# Patient Record
Sex: Female | Born: 1990 | Hispanic: Yes | Marital: Single | Smoking: Never smoker
Health system: Southern US, Community
[De-identification: ages and names within clinical notes are randomized; demographics above are authoritative.]

## PROBLEM LIST (undated history)

## (undated) DIAGNOSIS — I639 Cerebral infarction, unspecified: Secondary | ICD-10-CM

## (undated) DIAGNOSIS — D649 Anemia, unspecified: Secondary | ICD-10-CM

## (undated) HISTORY — DX: Cerebral infarction, unspecified: I63.9

## (undated) HISTORY — DX: Anemia, unspecified: D64.9

---

## 2020-03-05 ENCOUNTER — Inpatient Hospital Stay (HOSPITAL_COMMUNITY)
Admission: AD | Admit: 2020-03-05 | Payer: Medicaid Other | Source: Home / Self Care | Admitting: Obstetrics and Gynecology

## 2020-03-30 ENCOUNTER — Other Ambulatory Visit: Payer: Self-pay | Admitting: Obstetrics and Gynecology

## 2020-03-30 DIAGNOSIS — Z364 Encounter for antenatal screening for fetal growth retardation: Secondary | ICD-10-CM

## 2020-03-30 DIAGNOSIS — Z363 Encounter for antenatal screening for malformations: Secondary | ICD-10-CM

## 2020-04-10 ENCOUNTER — Encounter: Payer: Self-pay | Admitting: *Deleted

## 2020-04-14 ENCOUNTER — Encounter: Payer: Self-pay | Admitting: *Deleted

## 2020-04-14 ENCOUNTER — Ambulatory Visit: Payer: Medicaid Other | Attending: Obstetrics and Gynecology

## 2020-04-14 ENCOUNTER — Ambulatory Visit: Payer: Medicaid Other | Admitting: *Deleted

## 2020-04-14 ENCOUNTER — Other Ambulatory Visit: Payer: Self-pay | Admitting: Obstetrics and Gynecology

## 2020-04-14 ENCOUNTER — Other Ambulatory Visit: Payer: Self-pay

## 2020-04-14 VITALS — BP 123/56 | HR 102

## 2020-04-14 DIAGNOSIS — Z364 Encounter for antenatal screening for fetal growth retardation: Secondary | ICD-10-CM

## 2020-04-14 DIAGNOSIS — Z363 Encounter for antenatal screening for malformations: Secondary | ICD-10-CM | POA: Diagnosis not present

## 2020-04-14 DIAGNOSIS — O34219 Maternal care for unspecified type scar from previous cesarean delivery: Secondary | ICD-10-CM | POA: Diagnosis present

## 2020-05-19 ENCOUNTER — Encounter: Payer: Self-pay | Admitting: *Deleted

## 2020-05-25 ENCOUNTER — Other Ambulatory Visit (INDEPENDENT_AMBULATORY_CARE_PROVIDER_SITE_OTHER): Payer: Medicaid Other | Admitting: Obstetrics and Gynecology

## 2020-05-25 NOTE — Progress Notes (Signed)
preop orders placed

## 2020-07-07 ENCOUNTER — Inpatient Hospital Stay (HOSPITAL_COMMUNITY): Payer: Medicaid Other | Admitting: Anesthesiology

## 2020-07-07 ENCOUNTER — Other Ambulatory Visit: Payer: Self-pay

## 2020-07-07 ENCOUNTER — Encounter (HOSPITAL_COMMUNITY): Payer: Self-pay | Admitting: Family Medicine

## 2020-07-07 ENCOUNTER — Inpatient Hospital Stay (HOSPITAL_COMMUNITY)
Admission: AD | Admit: 2020-07-07 | Discharge: 2020-07-10 | DRG: 787 | Disposition: A | Discharge status: Home / Self Care | Payer: Medicaid Other | Attending: Obstetrics and Gynecology | Admitting: Obstetrics and Gynecology

## 2020-07-07 ENCOUNTER — Encounter (HOSPITAL_COMMUNITY)
Admission: AD | Disposition: A | Discharge status: Home / Self Care | Payer: Self-pay | Source: Home / Self Care | Attending: Obstetrics and Gynecology

## 2020-07-07 DIAGNOSIS — O99019 Anemia complicating pregnancy, unspecified trimester: Secondary | ICD-10-CM | POA: Diagnosis present

## 2020-07-07 DIAGNOSIS — Z98891 History of uterine scar from previous surgery: Secondary | ICD-10-CM

## 2020-07-07 DIAGNOSIS — O9081 Anemia of the puerperium: Secondary | ICD-10-CM | POA: Diagnosis not present

## 2020-07-07 DIAGNOSIS — O34211 Maternal care for low transverse scar from previous cesarean delivery: Secondary | ICD-10-CM | POA: Diagnosis present

## 2020-07-07 DIAGNOSIS — O42913 Preterm premature rupture of membranes, unspecified as to length of time between rupture and onset of labor, third trimester: Principal | ICD-10-CM | POA: Diagnosis present

## 2020-07-07 DIAGNOSIS — Z975 Presence of (intrauterine) contraceptive device: Secondary | ICD-10-CM

## 2020-07-07 DIAGNOSIS — D62 Acute posthemorrhagic anemia: Secondary | ICD-10-CM | POA: Diagnosis not present

## 2020-07-07 DIAGNOSIS — Z3A36 36 weeks gestation of pregnancy: Secondary | ICD-10-CM | POA: Diagnosis not present

## 2020-07-07 DIAGNOSIS — Z8673 Personal history of transient ischemic attack (TIA), and cerebral infarction without residual deficits: Secondary | ICD-10-CM | POA: Diagnosis not present

## 2020-07-07 DIAGNOSIS — O42113 Preterm premature rupture of membranes, onset of labor more than 24 hours following rupture, third trimester: Secondary | ICD-10-CM

## 2020-07-07 DIAGNOSIS — Z30430 Encounter for insertion of intrauterine contraceptive device: Secondary | ICD-10-CM | POA: Diagnosis not present

## 2020-07-07 DIAGNOSIS — Z20822 Contact with and (suspected) exposure to covid-19: Secondary | ICD-10-CM | POA: Diagnosis present

## 2020-07-07 LAB — RESP PANEL BY RT-PCR (FLU A&B, COVID) ARPGX2
Influenza A by PCR: NEGATIVE
Influenza B by PCR: NEGATIVE
SARS Coronavirus 2 by RT PCR: NEGATIVE

## 2020-07-07 LAB — CBC
HCT: 32.5 % — ABNORMAL LOW (ref 36.0–46.0)
Hemoglobin: 10.5 g/dL — ABNORMAL LOW (ref 12.0–15.0)
MCH: 27.5 pg (ref 26.0–34.0)
MCHC: 32.3 g/dL (ref 30.0–36.0)
MCV: 85.1 fL (ref 80.0–100.0)
Platelets: 248 10*3/uL (ref 150–400)
RBC: 3.82 MIL/uL — ABNORMAL LOW (ref 3.87–5.11)
RDW: 14.1 % (ref 11.5–15.5)
WBC: 8.4 10*3/uL (ref 4.0–10.5)
nRBC: 0 % (ref 0.0–0.2)

## 2020-07-07 LAB — POCT FERN TEST: POCT Fern Test: NEGATIVE

## 2020-07-07 LAB — TYPE AND SCREEN
ABO/RH(D): A POS
Antibody Screen: NEGATIVE

## 2020-07-07 LAB — AMNISURE RUPTURE OF MEMBRANE (ROM) NOT AT ARMC: Amnisure ROM: POSITIVE

## 2020-07-07 SURGERY — Surgical Case
Anesthesia: Spinal

## 2020-07-07 MED ORDER — ONDANSETRON HCL 4 MG/2ML IJ SOLN: 4.0000 mg | Freq: Once | INTRAMUSCULAR | Status: DC | PRN

## 2020-07-07 MED ORDER — FAMOTIDINE IN NACL 20-0.9 MG/50ML-% IV SOLN
20.0000 mg | Freq: Once | INTRAVENOUS | Status: AC
  Administered 2020-07-07: 20 mg via INTRAVENOUS
  Filled 2020-07-07: qty 50

## 2020-07-07 MED ORDER — DIPHENHYDRAMINE HCL 25 MG PO CAPS
25.0000 mg | ORAL_CAPSULE | Freq: Four times a day (QID) | ORAL | Status: DC | PRN
Start: 2020-07-07 — End: 2020-07-10

## 2020-07-07 MED ORDER — PHENYLEPHRINE HCL-NACL 20-0.9 MG/250ML-% IV SOLN
INTRAVENOUS | Status: AC
  Filled 2020-07-07: qty 250

## 2020-07-07 MED ORDER — OXYCODONE HCL 5 MG/5ML PO SOLN: 5.0000 mg | Freq: Once | ORAL | Status: DC | PRN

## 2020-07-07 MED ORDER — MORPHINE SULFATE (PF) 0.5 MG/ML IJ SOLN
INTRAMUSCULAR | Status: AC
  Filled 2020-07-07: qty 10

## 2020-07-07 MED ORDER — HYDROMORPHONE HCL 1 MG/ML IJ SOLN
0.2500 mg | INTRAMUSCULAR | Status: DC | PRN
  Administered 2020-07-07: 0.5 mg via INTRAVENOUS

## 2020-07-07 MED ORDER — KETOROLAC TROMETHAMINE 30 MG/ML IJ SOLN
30.0000 mg | Freq: Four times a day (QID) | INTRAMUSCULAR | Status: AC
  Administered 2020-07-07 – 2020-07-08 (×3): 30 mg via INTRAVENOUS
  Filled 2020-07-07 (×3): qty 1

## 2020-07-07 MED ORDER — OXYCODONE HCL 5 MG PO TABS
5.0000 mg | ORAL_TABLET | ORAL | Status: DC | PRN
  Administered 2020-07-10: 5 mg via ORAL
  Filled 2020-07-07: qty 1

## 2020-07-07 MED ORDER — LACTATED RINGERS IV SOLN: INTRAVENOUS | Status: DC

## 2020-07-07 MED ORDER — OXYCODONE HCL 5 MG PO TABS: 5.0000 mg | ORAL_TABLET | Freq: Once | ORAL | Status: DC | PRN

## 2020-07-07 MED ORDER — PRENATAL MULTIVITAMIN CH
1.0000 | ORAL_TABLET | Freq: Every day | ORAL | Status: DC
  Administered 2020-07-08 – 2020-07-10 (×3): 1 via ORAL
  Filled 2020-07-07 (×3): qty 1

## 2020-07-07 MED ORDER — NALBUPHINE HCL 10 MG/ML IJ SOLN
5.0000 mg | INTRAMUSCULAR | Status: DC | PRN
  Filled 2020-07-07: qty 1

## 2020-07-07 MED ORDER — NALBUPHINE HCL 10 MG/ML IJ SOLN
5.0000 mg | Freq: Once | INTRAMUSCULAR | Status: AC | PRN
  Administered 2020-07-08: 5 mg via INTRAVENOUS

## 2020-07-07 MED ORDER — IBUPROFEN 600 MG PO TABS
600.0000 mg | ORAL_TABLET | Freq: Four times a day (QID) | ORAL | Status: DC
  Administered 2020-07-08 – 2020-07-10 (×9): 600 mg via ORAL
  Filled 2020-07-07 (×9): qty 1

## 2020-07-07 MED ORDER — ACETAMINOPHEN 325 MG PO TABS
650.0000 mg | ORAL_TABLET | ORAL | Status: DC | PRN
  Administered 2020-07-08 – 2020-07-10 (×7): 650 mg via ORAL
  Filled 2020-07-07 (×8): qty 2

## 2020-07-07 MED ORDER — KETOROLAC TROMETHAMINE 30 MG/ML IJ SOLN
INTRAMUSCULAR | Status: AC
  Filled 2020-07-07: qty 1

## 2020-07-07 MED ORDER — SIMETHICONE 80 MG PO CHEW
80.0000 mg | CHEWABLE_TABLET | Freq: Three times a day (TID) | ORAL | Status: DC
  Administered 2020-07-07 – 2020-07-10 (×7): 80 mg via ORAL
  Filled 2020-07-07 (×8): qty 1

## 2020-07-07 MED ORDER — KETOROLAC TROMETHAMINE 30 MG/ML IJ SOLN
30.0000 mg | Freq: Four times a day (QID) | INTRAMUSCULAR | Status: AC | PRN
  Administered 2020-07-07: 30 mg via INTRAVENOUS

## 2020-07-07 MED ORDER — ONDANSETRON HCL 4 MG/2ML IJ SOLN
INTRAMUSCULAR | Status: AC
  Filled 2020-07-07: qty 2

## 2020-07-07 MED ORDER — LEVONORGESTREL 20.1 MCG/DAY IU IUD
1.0000 | INTRAUTERINE_SYSTEM | Freq: Once | INTRAUTERINE | Status: AC
  Administered 2020-07-07: 1 via INTRAUTERINE

## 2020-07-07 MED ORDER — ONDANSETRON HCL 4 MG/2ML IJ SOLN: 4.0000 mg | Freq: Three times a day (TID) | INTRAMUSCULAR | Status: DC | PRN

## 2020-07-07 MED ORDER — SCOPOLAMINE 1 MG/3DAYS TD PT72
MEDICATED_PATCH | TRANSDERMAL | Status: AC
  Filled 2020-07-07: qty 1

## 2020-07-07 MED ORDER — HYDROMORPHONE HCL 1 MG/ML IJ SOLN
INTRAMUSCULAR | Status: AC
  Filled 2020-07-07: qty 0.5

## 2020-07-07 MED ORDER — KETOROLAC TROMETHAMINE 30 MG/ML IJ SOLN: 30.0000 mg | Freq: Four times a day (QID) | INTRAMUSCULAR | Status: AC | PRN

## 2020-07-07 MED ORDER — LEVONORGESTREL 20.1 MCG/DAY IU IUD
INTRAUTERINE_SYSTEM | INTRAUTERINE | Status: AC
  Filled 2020-07-07: qty 1

## 2020-07-07 MED ORDER — DIPHENHYDRAMINE HCL 25 MG PO CAPS: 25.0000 mg | ORAL_CAPSULE | ORAL | Status: DC | PRN

## 2020-07-07 MED ORDER — PHENYLEPHRINE HCL-NACL 20-0.9 MG/250ML-% IV SOLN
INTRAVENOUS | Status: DC | PRN
  Administered 2020-07-07: 60 ug/min via INTRAVENOUS

## 2020-07-07 MED ORDER — WITCH HAZEL-GLYCERIN EX PADS: 1.0000 "application " | MEDICATED_PAD | CUTANEOUS | Status: DC | PRN

## 2020-07-07 MED ORDER — MORPHINE SULFATE (PF) 0.5 MG/ML IJ SOLN
INTRAMUSCULAR | Status: DC | PRN
  Administered 2020-07-07: .15 mg via INTRATHECAL

## 2020-07-07 MED ORDER — SOD CITRATE-CITRIC ACID 500-334 MG/5ML PO SOLN
30.0000 mL | Freq: Once | ORAL | Status: DC
  Filled 2020-07-07: qty 15

## 2020-07-07 MED ORDER — FENTANYL CITRATE (PF) 100 MCG/2ML IJ SOLN
INTRAMUSCULAR | Status: AC
  Filled 2020-07-07: qty 2

## 2020-07-07 MED ORDER — SODIUM CHLORIDE 0.9 % IV SOLN
500.0000 mg | Freq: Once | INTRAVENOUS | Status: AC
  Administered 2020-07-07: 500 mg via INTRAVENOUS

## 2020-07-07 MED ORDER — MENTHOL 3 MG MT LOZG: 1.0000 | LOZENGE | OROMUCOSAL | Status: DC | PRN

## 2020-07-07 MED ORDER — NALOXONE HCL 0.4 MG/ML IJ SOLN: 0.4000 mg | INTRAMUSCULAR | Status: DC | PRN

## 2020-07-07 MED ORDER — SOD CITRATE-CITRIC ACID 500-334 MG/5ML PO SOLN
30.0000 mL | ORAL | Status: AC
  Administered 2020-07-07: 30 mL via ORAL

## 2020-07-07 MED ORDER — ZOLPIDEM TARTRATE 5 MG PO TABS: 5.0000 mg | ORAL_TABLET | Freq: Every evening | ORAL | Status: DC | PRN

## 2020-07-07 MED ORDER — OXYTOCIN-SODIUM CHLORIDE 30-0.9 UT/500ML-% IV SOLN
2.5000 [IU]/h | INTRAVENOUS | Status: AC
  Administered 2020-07-07: 2.5 [IU]/h via INTRAVENOUS
  Filled 2020-07-07: qty 500

## 2020-07-07 MED ORDER — BUPIVACAINE IN DEXTROSE 0.75-8.25 % IT SOLN
INTRATHECAL | Status: DC | PRN
  Administered 2020-07-07: 1.6 mL via INTRATHECAL

## 2020-07-07 MED ORDER — SIMETHICONE 80 MG PO CHEW: 80.0000 mg | CHEWABLE_TABLET | ORAL | Status: DC | PRN

## 2020-07-07 MED ORDER — SODIUM CHLORIDE 0.9 % IV SOLN
INTRAVENOUS | Status: AC
  Filled 2020-07-07: qty 500

## 2020-07-07 MED ORDER — DEXAMETHASONE SODIUM PHOSPHATE 10 MG/ML IJ SOLN
INTRAMUSCULAR | Status: DC | PRN
  Administered 2020-07-07: 10 mg via INTRAVENOUS

## 2020-07-07 MED ORDER — TETANUS-DIPHTH-ACELL PERTUSSIS 5-2.5-18.5 LF-MCG/0.5 IM SUSY: 0.5000 mL | PREFILLED_SYRINGE | Freq: Once | INTRAMUSCULAR | Status: DC

## 2020-07-07 MED ORDER — ONDANSETRON HCL 4 MG/2ML IJ SOLN
INTRAMUSCULAR | Status: DC | PRN
  Administered 2020-07-07: 4 mg via INTRAVENOUS

## 2020-07-07 MED ORDER — NALBUPHINE HCL 10 MG/ML IJ SOLN: 5.0000 mg | INTRAMUSCULAR | Status: DC | PRN

## 2020-07-07 MED ORDER — DIBUCAINE (PERIANAL) 1 % EX OINT: 1.0000 "application " | TOPICAL_OINTMENT | CUTANEOUS | Status: DC | PRN

## 2020-07-07 MED ORDER — FENTANYL CITRATE (PF) 100 MCG/2ML IJ SOLN
INTRAMUSCULAR | Status: DC | PRN
  Administered 2020-07-07: 15 ug via INTRATHECAL

## 2020-07-07 MED ORDER — COCONUT OIL OIL
1.0000 "application " | TOPICAL_OIL | Status: DC | PRN
  Administered 2020-07-10: 1 via TOPICAL

## 2020-07-07 MED ORDER — LACTATED RINGERS IV SOLN: INTRAVENOUS | Status: DC | PRN

## 2020-07-07 MED ORDER — DEXAMETHASONE SODIUM PHOSPHATE 10 MG/ML IJ SOLN
INTRAMUSCULAR | Status: AC
  Filled 2020-07-07: qty 1

## 2020-07-07 MED ORDER — NALOXONE HCL 4 MG/10ML IJ SOLN
1.0000 ug/kg/h | INTRAVENOUS | Status: DC | PRN
  Filled 2020-07-07: qty 5

## 2020-07-07 MED ORDER — NALBUPHINE HCL 10 MG/ML IJ SOLN: 5.0000 mg | Freq: Once | INTRAMUSCULAR | Status: AC | PRN

## 2020-07-07 MED ORDER — DIPHENHYDRAMINE HCL 50 MG/ML IJ SOLN
12.5000 mg | INTRAMUSCULAR | Status: DC | PRN
  Administered 2020-07-07: 12.5 mg via INTRAVENOUS
  Filled 2020-07-07: qty 1

## 2020-07-07 MED ORDER — CEFAZOLIN SODIUM-DEXTROSE 2-4 GM/100ML-% IV SOLN
2.0000 g | INTRAVENOUS | Status: AC
  Administered 2020-07-07: 2 g via INTRAVENOUS

## 2020-07-07 MED ORDER — SENNOSIDES-DOCUSATE SODIUM 8.6-50 MG PO TABS
2.0000 | ORAL_TABLET | Freq: Every day | ORAL | Status: DC
  Administered 2020-07-08 – 2020-07-09 (×2): 2 via ORAL
  Filled 2020-07-07 (×3): qty 2

## 2020-07-07 MED ORDER — SODIUM CHLORIDE 0.9% FLUSH: 3.0000 mL | INTRAVENOUS | Status: DC | PRN

## 2020-07-07 MED ORDER — MEPERIDINE HCL 25 MG/ML IJ SOLN: 6.2500 mg | INTRAMUSCULAR | Status: DC | PRN

## 2020-07-07 MED ORDER — SCOPOLAMINE 1 MG/3DAYS TD PT72
1.0000 | MEDICATED_PATCH | Freq: Once | TRANSDERMAL | Status: AC
  Administered 2020-07-07: 1.5 mg via TRANSDERMAL

## 2020-07-07 MED ORDER — OXYTOCIN-SODIUM CHLORIDE 30-0.9 UT/500ML-% IV SOLN
INTRAVENOUS | Status: DC | PRN
  Administered 2020-07-07: 400 mL via INTRAVENOUS

## 2020-07-07 SURGICAL SUPPLY — 31 items
BARRIER ADHS 3X4 INTERCEED (GAUZE/BANDAGES/DRESSINGS) ×2 IMPLANT
BENZOIN TINCTURE PRP APPL 2/3 (GAUZE/BANDAGES/DRESSINGS) ×2 IMPLANT
CLOTH BEACON ORANGE TIMEOUT ST (SAFETY) ×2 IMPLANT
DRSG OPSITE POSTOP 4X10 (GAUZE/BANDAGES/DRESSINGS) ×2 IMPLANT
ELECT REM PT RETURN 9FT ADLT (ELECTROSURGICAL) ×2
ELECTRODE REM PT RTRN 9FT ADLT (ELECTROSURGICAL) ×1 IMPLANT
EXTRACTOR VACUUM KIWI (MISCELLANEOUS) IMPLANT
GLOVE BIOGEL PI IND STRL 7.0 (GLOVE) ×1 IMPLANT
GLOVE BIOGEL PI INDICATOR 7.0 (GLOVE) ×1
GLOVE SURG ORTHO 8.0 STRL STRW (GLOVE) ×2 IMPLANT
GOWN STRL REUS W/TWL LRG LVL3 (GOWN DISPOSABLE) ×4 IMPLANT
KIT ABG SYR 3ML LUER SLIP (SYRINGE) IMPLANT
NEEDLE HYPO 25X5/8 SAFETYGLIDE (NEEDLE) IMPLANT
NS IRRIG 1000ML POUR BTL (IV SOLUTION) ×2 IMPLANT
PACK C SECTION WH (CUSTOM PROCEDURE TRAY) ×2 IMPLANT
PAD OB MATERNITY 4.3X12.25 (PERSONAL CARE ITEMS) ×2 IMPLANT
PENCIL SMOKE EVAC W/HOLSTER (ELECTROSURGICAL) ×2 IMPLANT
RTRCTR C-SECT PINK 25CM LRG (MISCELLANEOUS) IMPLANT
SPONGE LAP 18X18 RF (DISPOSABLE) ×2 IMPLANT
STRIP CLOSURE SKIN 1/2X4 (GAUZE/BANDAGES/DRESSINGS) ×2 IMPLANT
SUT MON AB-0 CT1 36 (SUTURE) ×4 IMPLANT
SUT PLAIN 0 NONE (SUTURE) IMPLANT
SUT PLAIN 2 0 XLH (SUTURE) ×2 IMPLANT
SUT VIC AB 0 CT1 27 (SUTURE) ×2
SUT VIC AB 0 CT1 27XBRD ANBCTR (SUTURE) ×2 IMPLANT
SUT VIC AB 2-0 CT1 27 (SUTURE) ×2
SUT VIC AB 2-0 CT1 TAPERPNT 27 (SUTURE) ×2 IMPLANT
SUT VIC AB 4-0 KS 27 (SUTURE) ×2 IMPLANT
TOWEL OR 17X24 6PK STRL BLUE (TOWEL DISPOSABLE) ×2 IMPLANT
TRAY FOLEY W/BAG SLVR 14FR LF (SET/KITS/TRAYS/PACK) ×2 IMPLANT
WATER STERILE IRR 1000ML POUR (IV SOLUTION) ×4 IMPLANT

## 2020-07-07 NOTE — Anesthesia Postprocedure Evaluation (Signed)
Anesthesia Post Note  Patient: Favor Kreh de [name]  Procedure(s) Performed: CESAREAN SECTION (N/A )     Patient location during evaluation: PACU Anesthesia Type: Spinal Level of consciousness: awake Pain management: pain level controlled Vital Signs Assessment: post-procedure vital signs reviewed and stable Respiratory status: spontaneous breathing, nonlabored ventilation, respiratory function stable and patient connected to nasal cannula oxygen Cardiovascular status: stable and blood pressure returned to baseline Postop Assessment: no apparent nausea or vomiting Anesthetic complications: no   No complications documented.  Last Vitals:  Vitals:   07/07/20 0830 07/07/20 0845  BP: 109/68   Pulse: 92 87  Resp: 15 15  Temp:    SpO2: 93% 94%    Last Pain:  Vitals:   07/07/20 0845  TempSrc:   PainSc: 3    Pain Goal:    LLE Motor Response: Purposeful movement (07/07/20 0845) LLE Sensation: Decreased (07/07/20 0800) RLE Motor Response: Purposeful movement (07/07/20 0845) RLE Sensation: Decreased (07/07/20 0800)     Epidural/Spinal Function Cutaneous sensation: Able to Discern Pressure (07/07/20 0845), Patient able to flex knees: Yes (07/07/20 0845), Patient able to lift hips off bed: No (07/07/20 0845), Back pain beyond tenderness at insertion site: No (07/07/20 0845), Progressively worsening motor and/or sensory loss: No (07/07/20 0845), Bowel and/or bladder incontinence post epidural: No (07/07/20 0845)  Keaundre Thelin P Denetra Formoso

## 2020-07-07 NOTE — Discharge Summary (Addendum)
Postpartum Discharge Summary  Date of Service updated     Patient Name: Ruth Castillo DOB: [birth year] MRN: [id number]  Date of admission: 07/07/2020 Delivery date:07/07/2020  Delivering provider: Janyth Pupa  Date of discharge: 07/10/2020  Admitting diagnosis: History of cesarean delivery [Z98.891] Intrauterine pregnancy: [redacted]w[redacted]d    Secondary diagnosis:  Active Problems:   History of cesarean delivery   Cesarean delivery delivered   Anemia of pregnancy   IUD (intrauterine device) in place  Additional problems: none    Discharge diagnosis: Preterm Pregnancy Delivered and Anemia                                              Post partum procedures: post placental liletta, intra-op Augmentation: N/A Complications: RXYI>01hours  Hospital course: Onset of Labor With Unplanned C/S   30y.o. yo [id number] admitted in Latent Labor on 07/07/2020. [facility] 1 week [facility]. Discussed prospect of TOLAC vs elective rLTCS and patient elected for rLTCS. The patient went for cesarean section due to  PPROM, prior cesarean section . Delivery details as follows: Membrane Rupture Time/Date: 6:52 AM ,07/07/2020   Delivery Method:C-Section, Low Transverse  Details of operation can be found in separate operative note. Patient had an uncomplicated postpartum course.  She is ambulating,tolerating a regular diet, passing flatus, and urinating well.  Patient is discharged home in stable condition 07/10/20.  Newborn Data: Birth date:07/07/2020  Birth time:6:53 AM  Gender:Female  Living status:Living  Apgars: , 8,9 Weight:3305 g   Magnesium Sulfate received: No BMZ received: No Rhophylac:N/A MMR:N/A T-DaP:Given prenatally Flu: No Transfusion:No  Physical exam  Vitals:   07/09/20 0536 07/09/20 1432 07/09/20 2051 07/10/20 0511  BP: 122/79 119/77 120/84 120/76  Pulse: 84 92 (!) 103 80  Resp: 16 16 18 18   Temp: 98 F (36.7 C) 97.8 F (36.6 C) 98.6  F (37 C) 98.2 F (36.8 C)  TempSrc: Oral Oral Oral Oral  SpO2:  99% 99% 98%  Weight:      Height:       General: alert, cooperative and no distress Lochia: appropriate Uterine Fundus: firm Incision: Healing well with no significant drainage, No significant erythema, Dressing is clean, dry, and intact DVT Evaluation: No evidence of DVT seen on physical exam. Labs: Lab Results  Component Value Date   WBC 16.3 (H) 07/08/2020   HGB 8.9 (L) 07/08/2020   HCT 27.6 (L) 07/08/2020   MCV 83.1 07/08/2020   PLT 231 07/08/2020   No flowsheet data found. Edinburgh Score: Edinburgh Postnatal Depression Scale Screening Tool 07/09/2020  I have been able to laugh and see the funny side of things. 1  I have looked forward with enjoyment to things. 3  I have blamed myself unnecessarily when things went wrong. 0  I have been anxious or worried for no good reason. 1  I have felt scared or panicky for no good reason. 0  Things have been getting on top of me. 1  I have been so unhappy that I have had difficulty sleeping. 0  I have felt sad or miserable. 1  I have been so unhappy that I have been crying. 0  The thought of harming myself has occurred to me. 0  Edinburgh Postnatal Depression Scale Total 7     After visit meds:  Allergies as of 07/10/2020   No Known Allergies      Medication List     STOP taking these medications    prenatal multivitamin Tabs tablet       TAKE these medications    acetaminophen 325 MG tablet Commonly known as: TYLENOL Take 2 tablets (650 mg total) by mouth every 4 (four) hours as needed for mild pain (temperature > 101.5.).   coconut oil Oil Apply 1 application topically as needed.   ferrous sulfate 325 (65 FE) MG tablet Take 1 tablet (325 mg total) by mouth every other day.   ibuprofen 600 MG tablet Commonly known as: ADVIL Take 1 tablet (600 mg total) by mouth every 6 (six) hours.   oxyCODONE 5 MG immediate release tablet Commonly known as:  Oxy IR/ROXICODONE Take 1 tablet (5 mg total) by mouth every 4 (four) hours as needed for moderate pain.   simethicone 80 MG chewable tablet Commonly known as: MYLICON Chew 1 tablet (80 mg total) by mouth as needed for flatulence.         Discharge home in stable condition Infant Feeding: Breast Infant Disposition:home with mother Discharge instruction: per After Visit Summary and Postpartum booklet. Activity: Advance as tolerated. Pelvic rest for 6 weeks.  Diet: routine diet Future Appointments: Future Appointments  Date Time Provider [place]  07/14/2020  3:00 PM [facility] NURSE [facility]   Follow up Visit:  Message sent to [facility] to schedule 1 week visit for incision check, patient instructed to follow up at [facility] for 6 week postpartum visit/string check.   Please schedule this patient for a In person postpartum visit in 6 weeks with the following provider: Any provider. Additional Postpartum F/U:Incision check 1 week  Low risk pregnancy complicated by:  n/a Delivery mode:  C-Section, Low Transverse  Anticipated Birth Control:  PP IUD placed, string check at pp visit   07/10/2020 Delora Fuel, MD I personally saw and evaluated the patient, performing the key elements of the service. I developed and verified the management plan that is described in the resident's/student's note, and I agree with the content with my edits above. VSS, HRR&R, Resp unlabored, Legs neg.  Nigel Berthold, CNM 07/15/2020 9:30 AM

## 2020-07-07 NOTE — H&P (Signed)
OBSTETRIC ADMISSION HISTORY AND PHYSICAL  Ruth Castillo is a 30 y.o. female G3P0011 with IUP at [redacted]w[redacted]d by early u/s presenting for PPROM approx 1 week ago. She reports +FMs, no VB, no blurry vision, headaches or peripheral edema, and RUQ pain.  She plans on breast feeding. She request post placental liletta for birth control. She received her prenatal care at [facility]   Dating: By early u/s --->  Estimated Date of Delivery: 08/04/20  Sono:    04/14/20@[redacted]w[redacted]d , CWD, normal anatomy, cephalic presentation, posterior fundal placental lie, 650g, 41% EFW   Prenatal History/Complications:  History of cesarean section ([place], arrest of dilation)  Past Medical History: Past Medical History:  Diagnosis Date  . Anemia   . Stroke [facility])    mild per pt    Past Surgical History: Past Surgical History:  Procedure Laterality Date  . CESAREAN SECTION      Obstetrical History: OB History    Gravida  3   Para      Term      Preterm      AB  1   Living  1     SAB  1   IAB      Ectopic      Multiple      Live Births              Social History Social History   Socioeconomic History  . Marital status: Single    Spouse name: Not on file  . Number of children: Not on file  . Years of education: Not on file  . Highest education level: Not on file  Occupational History  . Not on file  Tobacco Use  . Smoking status: Never Smoker  . Smokeless tobacco: Never Used  Vaping Use  . Vaping Use: Never used  Substance and Sexual Activity  . Alcohol use: Never  . Drug use: Never  . Sexual activity: Not on file  Other Topics Concern  . Not on file  Social History Narrative  . Not on file   Social Determinants of Health   Financial Resource Strain: Not on file  Food Insecurity: Not on file  Transportation Needs: Not on file  Physical Activity: Not on file  Stress: Not on file  Social Connections: Not on file    Family History: History reviewed. No pertinent  family history.  Allergies: No Known Allergies  Medications Prior to Admission  Medication Sig Dispense Refill Last Dose  . Prenatal Vit-Fe Fumarate-FA (PRENATAL MULTIVITAMIN) TABS tablet Take 1 tablet by mouth daily at 12 noon.   Past Week at Unknown time     Review of Systems   All systems reviewed and negative except as stated in HPI  Blood pressure 126/84, pulse (!) 106, temperature 99 F (37.2 C), temperature source Oral, resp. rate 18, height 5\' 2"  (1.575 m), weight 89 kg, last menstrual period 10/19/2019, SpO2 99 %. General appearance: alert, cooperative and no distress Lungs: normal respiratory effort Heart: regular rate and rhythm Abdomen: soft, non-tender; gravid Pelvic: as noted below Extremities: Homans sign is negative, no sign of DVT Fetal monitoringBaseline: 140 bpm, Variability: Good {> 6 bpm), Accelerations: Reactive and Decelerations: Absent Uterine activityNone     Prenatal labs: [facility] records ABO, Rh:  A pos Antibody:  neg Rubella:  imm RPR:   nonreactive HBsAg:   nonreactive HIV:   neg GBS:   unk given GA 1 hr Glucola passed Genetic screening  normal Anatomy 10/21/2019 normal  Prenatal Transfer Tool  Maternal Diabetes: No Genetic Screening: Normal Maternal Ultrasounds/Referrals: Normal Fetal Ultrasounds or other Referrals:  None Maternal Substance Abuse:  No Significant Maternal Medications:  None Significant Maternal Lab Results: Other: GBS unk  Results for orders placed or performed during the hospital encounter of 07/07/20 (from the past 24 hour(s))  Fern Test   Collection Time: 07/07/20  2:30 AM  Result Value Ref Range   POCT Fern Test Negative = intact amniotic membranes   Amnisure rupture of membrane (rom)not at [facility]   Collection Time: 07/07/20  3:25 AM  Result Value Ref Range   Amnisure ROM POSITIVE     There are no problems to display for this patient.   Assessment/Plan:  Ruth Castillo is a 30 y.o. G3P0011 at [redacted]w[redacted]d here for  PPROM approx 1 week ago.  #elective rLTCS  The risks of cesarean section were discussed with the patient including but were not limited to: bleeding which may require transfusion or reoperation; infection which may require antibiotics; injury to bowel, bladder, ureters or other surrounding organs; injury to the fetus; need for additional procedures including hysterectomy in the event of a life-threatening hemorrhage; placental abnormalities wth subsequent pregnancies, incisional problems, thromboembolic phenomenon and other postoperative/anesthesia complications.  Patient also desires permanent sterilization.  Other reversible forms of contraception were discussed with patient; she declines all other modalities. Risks of procedure discussed with patient including but not limited to: risk of regret, permanence of method, bleeding, infection, injury to surrounding organs and need for additional procedures.  Failure risk of about 1% with increased risk of ectopic gestation if pregnancy occurs was also discussed with patient.  Also discussed possibility of post-tubal pain syndrome. The patient concurred with the proposed plan, giving informed written consent for the procedures.  Patient has been NPO since 1930 on 07/06/20 she will remain NPO for procedure. Anesthesia and OR aware.  Preoperative prophylactic antibiotics and SCDs ordered on call to the OR.  To OR when ready.  #Pain: spinal #FWB: Cat 1 #ID: GBS unk, intra-op surgical prophylaxis with azithro/ancef #MOF: breast #MOC: post placental liletta, discussed risks/benefits #Circ:  Girl #Language barrier: stratus interpreter used for entirety of visit  Alric Seton, MD  07/07/2020, 4:19 AM

## 2020-07-07 NOTE — Lactation Note (Signed)
This note was copied from a baby's chart. Lactation Consultation Note  Patient Name: Ruth Castillo NIOEV'O Date: 07/07/2020 Reason for consult: Initial assessment;Late-preterm 34-36.6wks   Age:28 hours, LPTI , C/S delivery with 2 void diapers. LC entered room with in house Spanish Interpreter, " Wallene Huh." Infant has not breastfeed  since birth, infant  has been  given, " formula only", this is mom's choice of feeding currently due to her  N&V with pain and discomfort. See flow sheet doc's:  infant had 22 kcal formula at:  09:25-13 mls of 22 kcal formula, 12:10 pm -13 mls of formula and 14:10 pm -13 mls of formula. Mom's feeding choice is breast and formula feeding prior to admission. LC suggested to mom  at the next infant  Feeding,  RN or LC could assist mom with latching infant at the breast,  per mom,  she doesn't feel like sitting up to breastfeed infant at the next feeding. LC mention mom could breastfeed infant in a side-lying position so that she would not have to sit up, mom declined, per mom she will wait and breastfeed infant later when she feels better. Mom was given hand pump for home use and was demonstrated on how to use the hand pump, immediately colostrum was in breast flange. LC discussed with mom she hears her concern, if possibly later today  when she is feeling better maybe latch infant at the breast to  prevent engorgement, mom's breast has a little fullness and possibly may lead to  edema. Mom knows to call RN or LC if she would like assistance with latching infant at the breast. Mom knows to feeding infant by cues, 8 to 12 or more times within 24 hours. Mom could benefit from using DEBP but she is not ready at this time due to N&V and pain, mom knows she can use the hand pump that was given by LC. Mom made aware of O/P services, breastfeeding support groups, community resources, and our phone # for post-discharge questions.  Mom receives [facility] in [place].    Maternal Data Has patient been taught Hand Expression?: Yes Does the patient have breastfeeding experience prior to this delivery?: Yes How long did the patient breastfeed?: Per mom, she breastfeed her 1st child for one year who currently is 42 years old.  Feeding Mother's Current Feeding Choice: Breast Milk and Formula Nipple Type: Extra Slow Flow  LATCH Score                    Lactation Tools Discussed/Used Tools: Pump Breast pump type: Manual Pump Education: Milk Storage;Setup, frequency, and cleaning Reason for Pumping: Prn, mom doesn't have breastpump at home.  Interventions Interventions: Breast feeding basics reviewed;Skin to skin;Position options;Education;Hand pump  Discharge Pump: Manual  Consult Status Consult Status: Follow-up Date: 07/08/20 Follow-up type: In-patient    Danelle Earthly 07/07/2020, 4:20 PM

## 2020-07-07 NOTE — Progress Notes (Signed)
Risk benefits and alternatives of cesarean section were discussed with the patient including but not limited to infection, bleeding, damage to bowel , bladder and baby with the need for further surgery. Pt voiced understanding and desires to proceed.   Myna Hidalgo, DO Attending Obstetrician & Gynecologist, [facility] for [employer], [facility]

## 2020-07-07 NOTE — Transfer of Care (Signed)
Immediate Anesthesia Transfer of Care Note  Patient: Ruth Castillo  Procedure(s) Performed: CESAREAN SECTION (N/A )  Patient Location: PACU  Anesthesia Type:Spinal  Level of Consciousness: awake, alert  and patient cooperative  Airway & Oxygen Therapy: Patient Spontanous Breathing  Post-op Assessment: Report given to RN and Post -op Vital signs reviewed and stable  Post vital signs: Reviewed and stable  Last Vitals:  Vitals Value Taken Time  BP 105/70 07/07/20 0746  Temp    Pulse 90 07/07/20 0749  Resp 16 07/07/20 0749  SpO2 95 % 07/07/20 0749  Vitals shown include unvalidated device data.  Last Pain:  Vitals:   07/07/20 0221  TempSrc: Oral         Complications: No complications documented.

## 2020-07-07 NOTE — Discharge Instructions (Signed)
Cuidados en el postparto luego de un parto por cesrea Postpartum Care After Cesarean Delivery Lea esta informacin sobre cmo cuidarse desde el momento en que nazca su beb y Elaina Hoops 6 a 12 semanas despus del parto (perodo del posparto). El mdico tambin podr darle indicaciones ms especficas. Comunquese con el mdico si tiene problemas o preguntas. Siga estas indicaciones en su casa: Medicamentos  [employer] de venta libre y los recetados solamente como se lo haya indicado el mdico.  Si le recetaron un antibitico, tmelo como se lo haya indicado el mdico. No deje de tomar el antibitico aunque comience a sentirse mejor.  Pregntele al mdico si el medicamento recetado: ? Hace que sea necesario que evite conducir o usar maquinaria pesada. ? Puede causarle estreimiento. Es posible que deba tomar medidas para prevenir o tratar el estreimiento, por ejemplo:  Beber suficiente lquido como para Pharmacologist la orina de color amarillo plido.  Tomar medicamentos recetados o de [employer].  Consumir alimentos ricos en fibra, como frijoles, cereales integrales, y frutas y verduras frescas.  Limitar su consumo de alimentos ricos en grasa y azcares procesados, como los alimentos fritos o dulces. Actividad  Retome sus actividades normales de a poco segn lo indicado por el mdico.  Evite las actividades que demandan mucho esfuerzo y Engineer, drilling (que son extenuantes) hasta que el mdico se lo autorice. Siempre es ms seguro caminar a un ritmo tranquilo a moderado. Pregntele al mdico qu actividades son seguras para usted. ? No levante objetos que pesen ms que su beb o ms de 10 libras (4,5 kg), como se lo haya indicado el mdico. ? No pase la aspiradora, suba escaleras ni conduzca un vehculo durante el tiempo que le indique el mdico.  De ser posible, pdale a alguien que le brinde ayuda con las tareas domsticas hasta que pueda realizar sus actividades habituales por su  cuenta.  Descanse todo lo que pueda. Trate de descansar o tomar una siesta mientras el beb duerme. Hemorragia vaginal  Es normal tener un poco de hemorragia vaginal (loquios) despus del parto. Use un apsito sanitario para absorber el sangrado vaginal y la secrecin. ? Durante la primera semana despus del parto, la cantidad y el aspecto de los loquios a menudo es similar a las del perodo menstrual. ? Durante las siguientes semanas disminuir gradualmente hasta convertirse en una secrecin seca amarronada o [place]. ? En la [employer], los loquios se detienen [employer] 4 a 6semanas despus del [place]. Los sangrados vaginales pueden variar de mujer a Nurse, learning disability.  Cambie los apsitos sanitarios con frecuencia. Observe si hay cambios en el flujo, como: ? Aumento repentino en el volumen. ? Cambio en el color. ? Cogulos sanguneos grandes.  Si expulsa un cogulo de sangre, gurdelo y llame al mdico para informrselo. No deseche los cogulos de sangre por el inodoro antes de recibir indicaciones del mdico.  No use tampones ni se haga duchas vaginales hasta que el mdico la autorice.  Si no est amamantando, volver a tener su perodo entre 6 y 8 semanas despus del parto. Si est amamantando, puede volver a tener su perodo [place] 8 semanas despus del parto y el momento en que deje de Museum/gallery exhibitions officer. Cuidados perineales  Si su cesrea no fue planeada, y pas por el proceso de [place] de parto y puj antes del nacimiento, podra Surveyor, mining, hinchazn y Associate Professor del tejido que se encuentra entre la abertura de la vagina y el ano (perineo). Tambin pueden haberle  hecho una incisin en el tejido (episiotoma) o el tejido puede haberse desgarrado durante el [place]. Siga las siguientes indicaciones como se lo haya indicado su mdico: ? Mantenga el perineo limpio y Personnel officer se lo haya indicado el mdico. Utilice apsitos o aerosoles analgsicos y Control and instrumentation engineer, como se lo hayan  indicado. ? Si le realizaron una episiotoma o un desgarro vaginal, controle la zona [employer] para detectar signos de infeccin. Est atento a los siguientes signos:  Enrojecimiento, Optician, dispensing.  Lquido o sangre.  Calor.  Pus o mal olor. ? Es posible que le den una botella rociadora para que use en lugar de limpiarse el rea con papel higinico despus de usar el bao. Cuando comience a Barrister's clerk, podr usar la botella rociadora antes de secarse. Asegrese de secarse suavemente. ? Para Engineer, materials causado por una episiotoma, un desgarro vaginal o hemorroides, trate de tomar un bao de asiento tibio 2 o 3 veces por da. Un bao de asiento es un bao de agua tibia que se toma mientras se est sentado. El agua solo debe Adult nurse las caderas y cubrir las nalgas.   Cuidado de las [address]  En los [address] despus del parto, las mamas pueden sentirse pesadas, llenas e incmodas (congestin [place]). Tambin puede tener [employer] se escapa de sus senos. El mdico puede sugerirle mtodos para Systems analyst. La congestin mamaria debera desaparecer al cabo de [employer].  Si est amamantando: ? Use un sostn que sujete y ajuste bien sus pechos. ? Mantenga los pezones secos y limpios. Aplquese cremas y Energy [employer] Partners se lo haya indicado el mdico. ? Es posible que deba usar discos de algodn en el sostn para Insurance account manager [employer] se filtre. ? Puede tener contracciones uterinas cada vez que amamante durante varias semanas despus del [place]. Las contracciones uterinas ayudan al tero a Hotel manager a su tamao habitual. ? Si tiene algn problema con la lactancia materna, consulte con su mdico o con un Holiday representative.  Si no est amamantando: ? Evite tocarse las [address], ya que esto puede hacer que produzcan ms [place]. ? Use un sostn que le proporcione el ajuste correcto y compresas fras para reducir la hinchazn. ? No extraiga (saque) [employer]. Esto har  que produzca ms [employer]. Intimidad y sexualidad  Pregntele al mdico cundo puede retomar la actividad sexual. Esto puede depender de lo siguiente: ? Riesgo de sufrir una infeccin. ? Velocidad de cicatrizacin. ? Comodidad y deseo de [employer] sexual.  Despus del parto, puede quedar embarazada incluso si no ha tenido todava su perodo. Si lo desea, hable con el mdico acerca de los mtodos de planificacin familiar o control de la natalidad (mtodos anticonceptivos). Estilo de vida  No consuma ningn producto que contenga nicotina o tabaco, como cigarrillos, cigarrillos electrnicos y tabaco de Theatre manager. Si necesita ayuda para dejar de consumir, consulte al mdico.  No beba alcohol, especialmente si est amamantando. Comida y bebida  Beba suficiente lquido como para Pharmacologist la orina de color amarillo plido.  Coma alimentos ricos en [employer]. Estos pueden ayudarla a prevenir o Educational psychologist. Los alimentos ricos en fibra incluyen los siguientes: ? Panes y cereales integrales. ? Arroz integral. ? Armed forces operational officer. ? Nils Pyle y verduras frescas.  Tome sus vitaminas prenatales hasta la visita de control de posparto o hasta que su mdico le indique que puede dejar de tomarlas.   Indicaciones generales  Concurra a todas las  visitas de control para usted y el beb como se lo haya indicado el mdico. La mayora de las mujeres visita al mdico para un control de posparto dentro de las primeras 3 a 6 semanas despus del [place]. Comunquese con un mdico si:  Se siente incapaz de controlar los cambios que implica tener un beb recin nacido y esos sentimientos no desaparecen.  Siente tristeza o preocupacin de forma inusual.  Siente dolor en las mamas, o estn duras o enrojecidas.  Tiene fiebre.  Tiene dificultad para retener la [place] o para impedir que la orina se escape.  Tiene poco inters o falta de inters en actividades que solan gustarle.  No ha  amamantado para nada y no ha tenido un perodo menstrual durante 12 semanas despus del [place].  Dej de amamantar al beb y no ha tenido su perodo menstrual durante 12 semanas despus de dejar de Museum/gallery exhibitions officer.  Tiene preguntas sobre su cuidado o el del beb.  Elimina un cogulo de sangre por la vagina. Solicite ayuda inmediatamente si:  Midwife.  Presenta dificultad para respirar.  Tiene un dolor repentino e intenso en la pierna.  Tiene dolor intenso o clicos en el abdomen.  Tiene un sangrado tan intenso en la vagina que empapa ms de un apsito sanitario en [employer]. El sangrado no debe ser ms abundante que el perodo ms intenso que haya tenido.  Presenta dolor de cabeza intenso.  Se desmaya.  Tiene visin borrosa o Nurse, adult.  Tiene secrecin vaginal con mal olor.  Tiene pensamientos de autolesionarse o de lesionar al beb. Si alguna vez siente que puede lastimarse o Physicist, medical a Economist, o tiene pensamientos de poner fin a su vida, busque ayuda de inmediato. Puede dirigirse al servicio de emergencias ms cercano o comunicarse con:  El servicio de emergencias de su localidad (911 en [place]  Una lnea de asistencia al suicida y Visual merchandiser en crisis, como National Suicide Prevention Lifeline (Lnea Nacional de Prevencin del Suicidio), al 2707641351. Est disponible las 24 horas del da. Resumen  El perodo de tiempo desde el parto y Elaina Hoops 6 a 12 semanas despus del parto se denomina perodo posparto.  Retome sus actividades normales de a poco segn lo indicado por el mdico.  Concurra a todas las visitas de control para usted y Research scientist (physical sciences) se lo haya indicado el mdico. Esta informacin no tiene Theme park manager el consejo del mdico. Asegrese de hacerle al mdico cualquier pregunta que tenga. Document Revised: 11/22/2017 Document Reviewed: 11/22/2017 Elsevier Patient Education  2021 [employer].

## 2020-07-07 NOTE — MAU Note (Signed)
..  Ruth Castillo is a 30 y.o. at [redacted]w[redacted]d here in MAU reporting: Leaking of fluid since 06/30/2020. Patient states on the 26th she had some pains and had a small gush of fluid but it stopped. Since then every day she has felt about two small gushes but it stops. The fluid is odorless, thin, and clear.  Today she came in because she spoke to her mom and she suggested to come into MAU. +FM. Denies vaginal bleeding or contractions.  Pain score: 0/10 Vitals:   07/07/20 0221  BP: 126/84  Pulse: (!) 106  Resp: 18  Temp: 99 F (37.2 C)  SpO2: 99%     FHT: 150's monitors applied Lab orders placed from triage:  fern

## 2020-07-07 NOTE — Op Note (Addendum)
Ruth Castillo PROCEDURE DATE: 07/07/2020  PREOPERATIVE DIAGNOSES: Intrauterine pregnancy at [redacted]w[redacted]d weeks gestation; patient declines vag del attempt Desire for contraceptive management  POSTOPERATIVE DIAGNOSES: The same  PROCEDURE: Repeat Low Transverse Cesarean Section and Liletta IUD placement  SURGEON: Dr. Myna Hidalgo  ASSISTANT:  Dr. Mart Piggs  ANESTHESIOLOGY TEAM: Anesthesiologist: Leonides Grills, MD; Lucretia Kern, MD CRNA: Trellis Paganini, CRNA; Jennelle Human, CRNA  INDICATIONS: Ruth Castillo is a 30 y.o. [phone] at [redacted]w[redacted]d here for cesarean section secondary to the indications listed under preoperative diagnoses; please see preoperative note for further details.  The risks of cesarean section were discussed with the patient including but were not limited to: bleeding which may require transfusion or reoperation; infection which may require antibiotics; injury to bowel, bladder, ureters or other surrounding organs; injury to the fetus; need for additional procedures including hysterectomy in the event of a life-threatening hemorrhage; placental abnormalities wth subsequent pregnancies, incisional problems, thromboembolic phenomenon and other postoperative/anesthesia complications.  Patient also elects for post placental intrauterine device for long acting reversible contraception. The patient concurred with the proposed plan, giving informed written consent for the procedure.    FINDINGS:  Viable female infant in cephalic presentation.  Apgars 8 and 9.  Clear amniotic fluid.  Intact placenta, three vessel cord.  Normal uterus, fallopian tubes and ovaries bilaterally.  ANESTHESIA: Spinal  INTRAVENOUS FLUIDS: 2250 ml   ESTIMATED BLOOD LOSS: 611 ml URINE OUTPUT:  100 ml SPECIMENS: Placenta sent to pathology COMPLICATIONS: None immediate  PROCEDURE IN DETAIL:  The patient preoperatively received intravenous antibiotics and had sequential compression devices  applied to her lower extremities.  She was then taken to the operating room where spinal anesthesia was administered and was found to be adequate. She was then placed in a dorsal supine position with a leftward tilt, and prepped and draped in a sterile manner.  A foley catheter was placed into her bladder and attached to constant gravity.  After an adequate timeout was performed, a Pfannenstiel skin incision was made with scalpel on her preexisting scar and carried through to the underlying layer of fascia. The fascia was incised in the midline, and this incision was extended bilaterally using the Mayo scissors.  Kocher clamps were applied to the superior aspect of the fascial incision and the underlying rectus muscles were dissected off bluntly and sharply. The rectus muscles were separated in the midline and the peritoneum was entered sharply with Metzenbaum scissors. The Alexis self-retaining retractor was introduced into the abdominal cavity.  An adhesion was noted on the anterior uterus obstructing the site of the hysterotomy which was taken down. Attention was turned to the lower uterine segment where a low transverse hysterotomy was made with a scalpel and extended bilaterally bluntly.  The infant was successfully delivered, the cord was clamped and cut after one minute, and the infant was handed over to the awaiting neonatology team. Uterine massage was then administered, and the placenta delivered intact with a three-vessel cord. The uterus was then cleared of clots and debris.  A liletta was placed manually at the uterine fundus and kelly clamps were used to feed the strings through the cervical os. The hysterotomy was closed with 0 Vicryl in a running locked fashion, and an imbricating layer was also placed with 0 Vicryl.  Figure-of-eight 0 Vicryl serosal stitches were placed to help with hemostasis at the site of the previous adhesion that was taken down.  The pelvis was cleared of  all clot and debris.  Hemostasis was confirmed on all surfaces. The retractor was removed.   Irrigation was performed. The peritoneum was closed with a 0 Vicryl running stitch. The fascia was then closed using 0 Vicryl in a running fashion.  The subcutaneous layer was irrigated, reapproximated with 2-0 plain gut interrupted stitches, and the skin was closed with a 4-0 Vicryl subcuticular stitch. The patient tolerated the procedure well. Sponge, instrument and needle counts were correct x 3.  She was taken to the recovery room in stable condition.   Alric Seton, MD OB Fellow, Faculty Practice [facility], Center for [facility] 07/07/2020 7:42 AM   Attestation of Attending Supervision of Obstetric Fellow: Evaluation and management procedures were performed by the Obstetric Fellow under my supervision and collaboration.  I have reviewed the Obstetric Fellow's note and chart, and I agree with the management and plan. I have also made any necessary editorial changes.   Sharon Seller, DO Attending Obstetrician & Gynecologist, [facility] for [facility], [facility] 07/10/2020 8:57 AM

## 2020-07-07 NOTE — Anesthesia Preprocedure Evaluation (Addendum)
Anesthesia Evaluation  Patient identified by MRN, date of birth, ID band Patient awake    Reviewed: Allergy & Precautions, NPO status , Patient's Chart, lab work & pertinent test results  History of Anesthesia Complications Negative for: history of anesthetic complications  Airway Mallampati: II  TM Distance: >3 FB Neck ROM: Full    Dental   Pulmonary neg pulmonary ROS,    Pulmonary exam normal        Cardiovascular negative cardio ROS Normal cardiovascular exam     Neuro/Psych negative neurological ROS     GI/Hepatic negative GI ROS, Neg liver ROS,   Endo/Other  negative endocrine ROS  Renal/GU negative Renal ROS  negative genitourinary   Musculoskeletal negative musculoskeletal ROS (+)   Abdominal   Peds  Hematology negative hematology ROS (+)   Anesthesia Other Findings   Reproductive/Obstetrics (+) Pregnancy                            Anesthesia Physical Anesthesia Plan  ASA: II  Anesthesia Plan: Spinal   Post-op Pain Management:    Induction:   PONV Risk Score and Plan: 3 and Ondansetron and Treatment may vary due to age or medical condition  Airway Management Planned: Natural Airway  Additional Equipment: None  Intra-op Plan:   Post-operative Plan:   Informed Consent: I have reviewed the patients History and Physical, chart, labs and discussed the procedure including the risks, benefits and alternatives for the proposed anesthesia with the patient or authorized representative who has indicated his/her understanding and acceptance.       Plan Discussed with:   Anesthesia Plan Comments:        Anesthesia Quick Evaluation

## 2020-07-07 NOTE — Anesthesia Procedure Notes (Signed)
Spinal  Patient location during procedure: OR Reason for block: surgical anesthesia Staffing Performed: anesthesiologist  Anesthesiologist: Shalita Notte E, MD Preanesthetic Checklist Completed: patient identified, IV checked, risks and benefits discussed, surgical consent, monitors and equipment checked, pre-op evaluation and timeout performed Spinal Block Patient position: sitting Prep: DuraPrep and site prepped and draped Patient monitoring: continuous pulse ox, blood pressure and heart rate Approach: midline Location: L3-4 Injection technique: single-shot Needle Needle type: Pencan  Needle gauge: 24 G Needle length: 9 cm Assessment Events: CSF return Additional Notes Functioning IV was confirmed and monitors were applied. Sterile prep and drape, including hand hygiene and sterile gloves were used. The patient was positioned and the spine was prepped. The skin was anesthetized with lidocaine.  Free flow of clear CSF was obtained prior to injecting local anesthetic into the CSF. The needle was carefully withdrawn. The patient tolerated the procedure well.     

## 2020-07-07 NOTE — MAU Provider Note (Signed)
Event Date/Time   First Provider Initiated Contact with Patient 07/07/20 0317       S: Ms. Ruth Castillo is a 30 y.o. [id number] at [redacted]w[redacted]d  who presents to MAU today complaining of leaking of fluid since Tuesday. She denies vaginal bleeding. She denies contractions. She reports normal fetal movement.    O: BP 126/84 (BP Location: Right Arm)   Pulse (!) 106   Temp 99 F (37.2 C) (Oral)   Resp 18   Ht 5\' 2"  (1.575 m)   Wt 89 kg   LMP 10/19/2019   SpO2 99%   BMI 35.90 kg/m  GENERAL: Well-developed, well-nourished female in no acute distress.  HEAD: Normocephalic, atraumatic.  CHEST: Normal effort of breathing, regular heart rate ABDOMEN: Soft, nontender, gravid PELVIC: Normal external female genitalia. Vagina is pink and rugated. Cervix with normal contour, no lesions. Appears to be glistening. Normal discharge.  Negative pooling. 10/21/2019 and Amnisure Collected  Cervical exam:      Fetal Monitoring: FHT: 165 bpm, Mod Var, + Occ. Variable Decels, +Accels Toco: Occasional Mild  Results for orders placed or performed during the hospital encounter of 07/07/20 (from the past 24 hour(s))  Fern Test     Status: None   Collection Time: 07/07/20  2:30 AM  Result Value Ref Range   POCT Fern Test Negative = intact amniotic membranes      A: SIUP at [redacted]w[redacted]d  R/O pPROM  P: [redacted]w[redacted]d returns negative Amnisure pending   Crist Fat, CNM 07/07/2020 3:17 AM   Reassessment (3:51 AM)  -Amnisure returns positive. -Nurse instructed to contact L&D team for orders.  09/06/2020, CNM

## 2020-07-08 LAB — CBC
HCT: 27.6 % — ABNORMAL LOW (ref 36.0–46.0)
Hemoglobin: 8.9 g/dL — ABNORMAL LOW (ref 12.0–15.0)
MCH: 26.8 pg (ref 26.0–34.0)
MCHC: 32.2 g/dL (ref 30.0–36.0)
MCV: 83.1 fL (ref 80.0–100.0)
Platelets: 231 10*3/uL (ref 150–400)
RBC: 3.32 MIL/uL — ABNORMAL LOW (ref 3.87–5.11)
RDW: 13.5 % (ref 11.5–15.5)
WBC: 16.3 10*3/uL — ABNORMAL HIGH (ref 4.0–10.5)
nRBC: 0 % (ref 0.0–0.2)

## 2020-07-08 MED ORDER — SODIUM CHLORIDE 0.9 % IV SOLN
500.0000 mg | Freq: Once | INTRAVENOUS | Status: AC
  Administered 2020-07-08: 500 mg via INTRAVENOUS
  Filled 2020-07-08: qty 25

## 2020-07-08 MED ORDER — ASCORBIC ACID 500 MG PO TABS
250.0000 mg | ORAL_TABLET | ORAL | Status: DC
  Administered 2020-07-08: 250 mg via ORAL
  Filled 2020-07-08 (×2): qty 1

## 2020-07-08 MED ORDER — FERROUS SULFATE 325 (65 FE) MG PO TABS
325.0000 mg | ORAL_TABLET | ORAL | Status: DC
  Administered 2020-07-08: 325 mg via ORAL
  Filled 2020-07-08 (×2): qty 1

## 2020-07-08 NOTE — Social Work (Signed)
MOB was referred for history of anxiety.   * Referral screened out by Clinical Social Worker because none of the following criteria appear to apply:  ~ History of anxiety/depression during this pregnancy, or of post-partum depression following prior delivery. CSW reviewed chart and there are noted concerns.  ~ Diagnosis of anxiety and/or depression within last 3 years. OR * MOB's symptoms currently being treated with medication and/or therapy.  Please contact the Clinical Social Worker if needs arise, by MOB request, or if MOB scores greater than 9/yes to question 10 on Edinburgh Postpartum Depression Screen.  Dijon Kohlman, LCSWA Clinical Social Work [facility]  [phone] 

## 2020-07-08 NOTE — Lactation Note (Addendum)
This note was copied from a baby's chart. Lactation Consultation Note  Patient Name: Ruth Castillo [id number] Date: 07/08/2020 Reason for consult: Follow-up assessment;Late-preterm 34-36.6wks Age:28 hours P2, LPTI -5% weight loss. Interpreter  Infant stool has transition to green and seedy LC changed stool diaper while in room.  Mom is starting to breastfeed infant now that she is feeling better today. LC observed mom has a lot of breast fullness almost engorged, LC use hand pump on left breast to soften breast tissue , infant latched on left breast, sustained latch and swallows heard, with stimulation infant BF for 20 minutes afterwards was given 15 mls of 22kcal formula with extra slow flow ( purple and gold nipple). Mom used the DEBP and had colostrum being expressed in flange with volume.  Mom's plan: 1- Latch infant at the breast for each feeding, watch feeding cues, dont make infant wait to breast feed and limit total feeding to 30 minutes. 2- Do breast stimulation to keep infant awake, talking to infant, gently stroke infant's neck and shoulder and BF infant STS. 3- After Mom's latches infant at breast mom will supplement infant first with any EBM that she pumped and then offer formula Day 2 LPTI supplement amount is (10-20 ml) per feeding. 4- Mom will continue to use DEBP every 3 hours for 15 minutes on initial setting.  Maternal Data    Feeding Mother's Current Feeding Choice: Breast Milk and Formula Nipple Type: Extra Slow Flow  LATCH Score Latch: Grasps breast easily, tongue down, lips flanged, rhythmical sucking.  Audible Swallowing: Spontaneous and intermittent  Type of Nipple: Everted at rest and after stimulation  Comfort (Breast/Nipple): Soft / non-tender  Hold (Positioning): Assistance needed to correctly position infant at breast and maintain latch.  LATCH Score: 9   Lactation Tools Discussed/Used Tools: Pump Breast pump type: Double-Electric Breast  Pump  Interventions Interventions: Assisted with latch;Skin to skin;Breast compression;Adjust position;Support pillows;Position options;Expressed milk;DEBP;Education  Discharge Pump: DEBP  Consult Status Consult Status: Follow-up Date: 07/09/20 Follow-up type: In-patient    Danelle Earthly 07/08/2020, 10:40 PM

## 2020-07-08 NOTE — Lactation Note (Signed)
This note was copied from a baby's chart. Lactation Consultation Note  Patient Name: Girl Kashvi Prevette [id number] Date: 07/08/2020   Age:28 hours P1, LPTI, -5% weight loss mom is breast feeding and supplementing infant with Similac 22 kcal Neosure with iron. LC entered room, mom alseep in bed with infant in between her legs, LC placed infant in basinet and inform RN review safe sleep with mom. LC will make 2nd attempt later today to talk with mom about how breastfeeding is going.  Maternal Data    Feeding    LATCH Score                    Lactation Tools Discussed/Used    Interventions    Discharge    Consult Status      Danelle Earthly 07/08/2020, 5:31 PM

## 2020-07-08 NOTE — Progress Notes (Signed)
IV infiltrated before IV Venofer was completely infused (70ml left in bag).  PIV site was edematous and tender to touch; also had slight bruising right above insertion site.  Advised pt to elevate right arm.    Pt has been ambulating independently and denies dizziness/lightheadedness.  Currently sitting on side of bed using DEBP.  FOB with infant in laps and just finished feeding formula (61ml so far).

## 2020-07-08 NOTE — Progress Notes (Signed)
POSTPARTUM PROGRESS NOTE  Post Operative Day 1  Subjective:  Winifred Bodiford de [name] is a 30 y.o. [phone] s/p elective rLTCS at [redacted]w[redacted]d.  No acute events overnight.  Pt denies problems with ambulating, voiding or po intake.  She denies nausea or vomiting.  Pain is moderately controlled.  She continues to have itching well controlled with IM benadryl. She has had flatus. She has not had bowel movement.  Lochia Moderate.   Objective: Blood pressure 113/61, pulse 86, temperature 98.3 F (36.8 C), temperature source Oral, resp. rate 16, height 5\' 2"  (1.575 m), weight 89 kg, last menstrual period 10/19/2019, SpO2 95 %, unknown if currently breastfeeding.  Physical Exam:  General: alert, cooperative and no distress Chest: no respiratory distress Heart:regular rate, distal pulses intact Abdomen: soft, nontender,  Uterine Fundus: firm, appropriately tender DVT Evaluation: No calf swelling or tenderness Extremities: No LE edema Skin: warm, dry; incision clean/dry/intact  Recent Labs    07/07/20 0427  HGB 10.5*  HCT 32.5*    Assessment/Plan: Charlayne Vultaggio de Arsenio Katz is a 30 y.o. [phone] s/p rLTCS-elective at [redacted]w[redacted]d   POD#1 - Doing well, no significant pain and meeting milestones  Contraception: post placental liletta Feeding: breast Dispo: Plan for discharge likely on POD#2. Language Barrier: interpreter used throughout encounter   LOS: 1 day   [redacted]w[redacted]d, Medical Student 07/08/2020, 7:13 AM   GME ATTESTATION:  I saw and evaluated the patient. I agree with the findings and the plan of care as documented in the medical student's note.  09/07/2020, MD OB Fellow, Faculty [place] [facility], [facility] 07/08/2020 7:18 AM

## 2020-07-09 LAB — SURGICAL PATHOLOGY

## 2020-07-09 NOTE — Lactation Note (Signed)
This note was copied from a baby's chart. Lactation Consultation Note  Patient Name: Ruth Castillo [id number]'P Date: 07/09/2020 Age:28 hours  Mom and baby are sleeping upon visit. LC will come back to room at another time as possible.    Feeding Nipple Type: Extra Slow Flow   Bethaney Oshana A Higuera Ancidey 07/09/2020, 12:02 PM

## 2020-07-09 NOTE — Lactation Note (Signed)
This note was copied from a baby's chart. Lactation Consultation Note  Patient Name: Ruth Castillo LSLHT'D Date: 07/09/2020 Reason for consult: Follow-up assessment;Late-preterm 34-36.6wks Age:28 hours  Follow up visit to 55 hours old LPT infant with 6.20% weight loss. Parents are present during visit. Mother is holding infant. Mother states infant is feeding better but it is difficult to keep infant awake at breast. LC reviewed LPT behavior, feeding patterns and expectations.  Discussed supplementation volume according to age and encouraged pumping. Mother explains she used DEBP and collected ~47mL but had to dump because pump parts were dirty at the time of pumping. Per mother: "I was expecting to get nothing".   Last feeding infant breastfed for 15 minutes and FOI supplemented with 11-mL of formula.   Plan:   1-Breastfeeding on demand, ensuring a deep, comfortable latch.  2-Undressing infant and place skin to skin when ready to breastfeed 3-Keep infant awake during breastfeeding session: massaging breast, infant's hand/shoulder/feet 4-Preserve infant energy limiting feeding sessions to 30 min max.  5-Hand express or use DEBP for supplementation purposes. 6-Monitor voids and stools as signs good intake.  7-Contact LC as needed for feeds/support/concerns/questions  All questions answered. Family is excited to go home today.   Feeding Mother's Current Feeding Choice: Breast Milk and Formula  Lactation Tools Discussed/Used Breast pump type: Double-Electric Breast Pump Reason for Pumping: LPTI protocol Pumped volume: 6 mL (mother had to discard. pump parts were dirty at the time of pumping.)  Interventions Interventions: Breast feeding basics reviewed;Skin to skin;Hand express;Breast massage;Expressed milk;Hand pump;Education  Discharge Discharge Education: Engorgement and breast care;Warning signs for feeding baby Pump: Manual  Consult Status Consult Status:  Complete Date: 07/09/20 Follow-up type: Call as needed    Rhapsody Wolven A Higuera Ancidey 07/09/2020, 2:23 PM

## 2020-07-09 NOTE — Progress Notes (Signed)
POSTPARTUM PROGRESS NOTE  Post Operative Day 2  Subjective:  Elisavet Buehrer de [name] is a 30 y.o. [phone] s/p elective rLTCS at [redacted]w[redacted]d.  No acute events overnight.  Pt denies problems with ambulating, voiding or po intake.  She denies nausea or vomiting.  Pain is moderately controlled. She has had flatus. She has not had bowel movement.  Lochia Moderate.   Objective: Blood pressure 122/79, pulse 84, temperature 98 F (36.7 C), temperature source Oral, resp. rate 16, height 5\' 2"  (1.575 m), weight 89 kg, last menstrual period 10/19/2019, SpO2 96 %, unknown if currently breastfeeding.  Physical Exam:  General: alert, cooperative and no distress Chest: no respiratory distress Heart:regular rate, distal pulses intact Abdomen: soft, nontender,  Uterine Fundus: firm, appropriately tender DVT Evaluation: No calf swelling or tenderness Extremities: No LE edema Skin: warm, dry; incision clean/dry/intact  Recent Labs    07/07/20 0427 07/08/20 0657  HGB 10.5* 8.9*  HCT 32.5* 27.6*    Assessment/Plan: 09/07/20 de Arsenio Katz is a 30 y.o. 34 s/p rLTCS-elective at [redacted]w[redacted]d   POD#2 - Doing well, no significant pain and meeting milestones  Acute blood loss anemia: Hgb 10.5 > 8.9. S/p Venofer, will continue on PO iron.  Contraception: post placental liletta Feeding: breast Dispo: Plan for discharge on POD#3 Language Barrier: interpreter used throughout encounter   LOS: 2 days   [redacted]w[redacted]d, MD 07/09/2020, 7:13 AM

## 2020-07-10 MED ORDER — FERROUS SULFATE 325 (65 FE) MG PO TABS: 325.0000 mg | ORAL_TABLET | ORAL | 3 refills | Status: AC

## 2020-07-10 MED ORDER — OXYCODONE HCL 5 MG PO TABS: 5.0000 mg | ORAL_TABLET | ORAL | 0 refills | Status: DC | PRN

## 2020-07-10 MED ORDER — COCONUT OIL OIL: 1.0000 "application " | TOPICAL_OIL | 0 refills | Status: DC | PRN

## 2020-07-10 MED ORDER — IBUPROFEN 600 MG PO TABS: 600.0000 mg | ORAL_TABLET | Freq: Four times a day (QID) | ORAL | 0 refills | Status: AC

## 2020-07-10 MED ORDER — SIMETHICONE 80 MG PO CHEW: 80.0000 mg | CHEWABLE_TABLET | ORAL | 0 refills | Status: AC | PRN

## 2020-07-10 MED ORDER — ACETAMINOPHEN 325 MG PO TABS: 650.0000 mg | ORAL_TABLET | ORAL | Status: AC | PRN

## 2020-07-10 NOTE — Lactation Note (Signed)
This note was copied from a baby's chart. Lactation Consultation Note  Patient Name: Girl Anessia [place] KGURK'Y Date: 07/10/2020 Reason for consult: Follow-up assessment Age:28 hours  LC in to room after discharge ordered. Parents are comfortable with plan. Mother states her milk is in and infant is feeding well. No pain or discomfort reported.  Parents plan to supplement as needed with formula. Reviewed volume according to age. Mother explains has not used DEBP because it has not been needed.  Mother is expecting [facility] to call her back.  LC reviewed feeding plan. All questions answered. Urged parents to contact [facility] as needed for support/concerns/questions.  Feeding Mother's Current Feeding Choice: Breast Milk and Formula Nipple Type: Extra Slow Flow  Interventions Interventions: Breast feeding basics reviewed;Education;Hand pump;Ice;Expressed milk;Hand express;Breast massage;Skin to skin  Discharge Discharge Education: Engorgement and breast care WIC Program: Yes  Consult Status Consult Status: Complete Date: 07/10/20 Follow-up type: Call as needed    Massimo Hartland A Higuera Ancidey 07/10/2020, 12:10 PM

## 2020-07-14 ENCOUNTER — Ambulatory Visit (INDEPENDENT_AMBULATORY_CARE_PROVIDER_SITE_OTHER): Payer: Medicaid Other

## 2020-07-14 ENCOUNTER — Other Ambulatory Visit: Payer: Self-pay

## 2020-07-14 VITALS — BP 122/83 | HR 87 | Wt 178.3 lb

## 2020-07-14 DIAGNOSIS — Z98891 History of uterine scar from previous surgery: Secondary | ICD-10-CM

## 2020-07-14 NOTE — Progress Notes (Signed)
Pt here today for incision check.   Incision clean, dry and intact upon assessment. Steri-strips in place. Steri-strips removed and pt tolerated well. Pt advised to keep clean and dry. Pt advised to take no baths, only showers until PP visit. Pt to have PP visit at [facility] and will call for appt. Pt verbalized understanding and agreeable to plan of care.   Judeth Cornfield, RN  07/14/20

## 2020-07-14 NOTE — Progress Notes (Signed)
I was present for supervision and agree with the findings and care plan as documented in the RN note.    Marleen Moret, MD OB Family Medicine Fellow, [facility]  

## 2020-07-26 ENCOUNTER — Emergency Department (HOSPITAL_COMMUNITY): Payer: Medicaid Other

## 2020-07-26 ENCOUNTER — Encounter (HOSPITAL_COMMUNITY): Payer: Self-pay | Admitting: Emergency Medicine

## 2020-07-26 ENCOUNTER — Emergency Department (HOSPITAL_COMMUNITY)
Admission: EM | Admit: 2020-07-26 | Discharge: 2020-07-26 | Disposition: A | Discharge status: Home / Self Care | Payer: Medicaid Other | Attending: Emergency Medicine | Admitting: Emergency Medicine

## 2020-07-26 ENCOUNTER — Other Ambulatory Visit: Payer: Self-pay

## 2020-07-26 DIAGNOSIS — Z20822 Contact with and (suspected) exposure to covid-19: Secondary | ICD-10-CM | POA: Diagnosis not present

## 2020-07-26 DIAGNOSIS — K130 Diseases of lips: Secondary | ICD-10-CM

## 2020-07-26 DIAGNOSIS — J029 Acute pharyngitis, unspecified: Secondary | ICD-10-CM | POA: Insufficient documentation

## 2020-07-26 DIAGNOSIS — T8131XA Disruption of external operation (surgical) wound, not elsewhere classified, initial encounter: Secondary | ICD-10-CM | POA: Insufficient documentation

## 2020-07-26 DIAGNOSIS — X58XXXA Exposure to other specified factors, initial encounter: Secondary | ICD-10-CM | POA: Insufficient documentation

## 2020-07-26 DIAGNOSIS — R059 Cough, unspecified: Secondary | ICD-10-CM | POA: Insufficient documentation

## 2020-07-26 DIAGNOSIS — R000 Tachycardia, unspecified: Secondary | ICD-10-CM | POA: Insufficient documentation

## 2020-07-26 LAB — URINALYSIS, ROUTINE W REFLEX MICROSCOPIC
Bilirubin Urine: NEGATIVE
Glucose, UA: NEGATIVE mg/dL
Hgb urine dipstick: NEGATIVE
Ketones, ur: NEGATIVE mg/dL
Leukocytes,Ua: NEGATIVE
Nitrite: NEGATIVE
Protein, ur: NEGATIVE mg/dL
Specific Gravity, Urine: 1.018 (ref 1.005–1.030)
pH: 5 (ref 5.0–8.0)

## 2020-07-26 LAB — CBC WITH DIFFERENTIAL/PLATELET
Abs Immature Granulocytes: 0.01 10*3/uL (ref 0.00–0.07)
Basophils Absolute: 0 10*3/uL (ref 0.0–0.1)
Basophils Relative: 1 %
Eosinophils Absolute: 0.1 10*3/uL (ref 0.0–0.5)
Eosinophils Relative: 3 %
HCT: 38.3 % (ref 36.0–46.0)
Hemoglobin: 12 g/dL (ref 12.0–15.0)
Immature Granulocytes: 0 %
Lymphocytes Relative: 41 %
Lymphs Abs: 1.9 10*3/uL (ref 0.7–4.0)
MCH: 26.4 pg (ref 26.0–34.0)
MCHC: 31.3 g/dL (ref 30.0–36.0)
MCV: 84.2 fL (ref 80.0–100.0)
Monocytes Absolute: 0.6 10*3/uL (ref 0.1–1.0)
Monocytes Relative: 12 %
Neutro Abs: 2 10*3/uL (ref 1.7–7.7)
Neutrophils Relative %: 43 %
Platelets: 299 10*3/uL (ref 150–400)
RBC: 4.55 MIL/uL (ref 3.87–5.11)
RDW: 14.9 % (ref 11.5–15.5)
WBC: 4.7 10*3/uL (ref 4.0–10.5)
nRBC: 0 % (ref 0.0–0.2)

## 2020-07-26 LAB — I-STAT BETA HCG BLOOD, ED (MC, WL, AP ONLY): I-stat hCG, quantitative: <5 m[IU]/mL (ref <5)

## 2020-07-26 LAB — BASIC METABOLIC PANEL
Anion gap: 7 (ref 5–15)
BUN: 11 mg/dL (ref 6–20)
CO2: 24 mmol/L (ref 22–32)
Calcium: 9.3 mg/dL (ref 8.9–10.3)
Chloride: 106 mmol/L (ref 98–111)
Creatinine, Ser: 0.66 mg/dL (ref 0.44–1.00)
GFR, Estimated: >60 mL/min (ref >60)
Glucose, Bld: 107 mg/dL — ABNORMAL HIGH (ref 70–99)
Potassium: 3.9 mmol/L (ref 3.5–5.1)
Sodium: 137 mmol/L (ref 135–145)

## 2020-07-26 LAB — GROUP A STREP BY PCR: Group A Strep by PCR: NOT DETECTED

## 2020-07-26 LAB — RESP PANEL BY RT-PCR (FLU A&B, COVID) ARPGX2
Influenza A by PCR: NEGATIVE
Influenza B by PCR: NEGATIVE
SARS Coronavirus 2 by RT PCR: POSITIVE — AB

## 2020-07-26 MED ORDER — IBUPROFEN 600 MG PO TABS: 600.0000 mg | ORAL_TABLET | Freq: Four times a day (QID) | ORAL | 0 refills | Status: AC | PRN

## 2020-07-26 MED ORDER — SULFAMETHOXAZOLE-TRIMETHOPRIM 800-160 MG PO TABS: 1.0000 | ORAL_TABLET | Freq: Two times a day (BID) | ORAL | 0 refills | Status: AC

## 2020-07-26 MED ORDER — IBUPROFEN 400 MG PO TABS
600.0000 mg | ORAL_TABLET | Freq: Once | ORAL | Status: AC
  Administered 2020-07-26: 600 mg via ORAL
  Filled 2020-07-26: qty 1

## 2020-07-26 MED ORDER — HYDROCODONE-ACETAMINOPHEN 5-325 MG PO TABS
1.0000 | ORAL_TABLET | Freq: Once | ORAL | Status: AC
  Administered 2020-07-26: 1 via ORAL
  Filled 2020-07-26: qty 1

## 2020-07-26 MED ORDER — LIDOCAINE-EPINEPHRINE-TETRACAINE (LET) TOPICAL GEL
3.0000 mL | Freq: Once | TOPICAL | Status: AC
  Administered 2020-07-26: 3 mL via TOPICAL
  Filled 2020-07-26: qty 3

## 2020-07-26 NOTE — ED Provider Notes (Signed)
[facility]   CSN: [id number] Arrival date & time: 07/26/20  1517     History Chief Complaint  Patient presents with  . Wound Dehiscence    Ruth Castillo is a 30 y.o. female.  Pt presents to the ED today with f/c and a possible wound infection.  Pt said she had a c-section on 5/3.  She has had pain in her c-section area with some drainage.  The wound has opened a little bit.  She also c/o some cough and sore throat.  The pt also has a sore on her lower lip.  Pt is out of the pain meds she's been taking for her c-section.  She is breast and bottle feeding.  No redness or pain to her breasts.  Due to language barrier, an interpreter was present during the history-taking and subsequent discussion (and for part of the physical exam) with this patient.        Past Medical History:  Diagnosis Date  . Anemia   . Stroke [facility](HCC)    mild per pt    Patient Active Problem List   Diagnosis Date Noted  . History of cesarean delivery 07/07/2020  . Cesarean delivery delivered 07/07/2020  . Anemia of pregnancy 07/07/2020  . IUD (intrauterine device) in place 07/07/2020    Past Surgical History:  Procedure Laterality Date  . CESAREAN SECTION    . CESAREAN SECTION N/A 07/07/2020   Procedure: CESAREAN SECTION;  Surgeon: Myna Hidalgozan, Jennifer, DO;  Location: [facility] LD ORS;  Service: Obstetrics;  Laterality: N/A;     OB History    Gravida  3   Para  1   Term      Preterm  1   AB  1   Living  2     SAB  1   IAB      Ectopic      Multiple  0   Live Births  1           History reviewed. No pertinent family history.  Social History   Tobacco Use  . Smoking status: Never Smoker  . Smokeless tobacco: Never Used  Vaping Use  . Vaping Use: Never used  Substance Use Topics  . Alcohol use: Never  . Drug use: Never    Home Medications Prior to Admission medications   Medication Sig Start Date End Date Taking?  Authorizing Provider  ibuprofen (ADVIL) 600 MG tablet Take 1 tablet (600 mg total) by mouth every 6 (six) hours as needed. 07/26/20  Yes Jacalyn LefevreHaviland, Rayon Mcchristian, MD  sulfamethoxazole-trimethoprim (BACTRIM DS) 800-160 MG tablet Take 1 tablet by mouth 2 (two) times daily for 7 days. 07/26/20 08/02/20 Yes Jacalyn LefevreHaviland, Velia Pamer, MD  acetaminophen (TYLENOL) 325 MG tablet Take 2 tablets (650 mg total) by mouth every 4 (four) hours as needed for mild pain (temperature > 101.5.). 07/10/20   Maness, Loistine ChancePhilip, MD  ferrous sulfate 325 (65 FE) MG tablet Take 1 tablet (325 mg total) by mouth every other day. 07/10/20   Maness, Loistine ChancePhilip, MD  ibuprofen (ADVIL) 600 MG tablet Take 1 tablet (600 mg total) by mouth every 6 (six) hours. 07/10/20   Maness, Loistine ChancePhilip, MD  simethicone (MYLICON) 80 MG chewable tablet Chew 1 tablet (80 mg total) by mouth as needed for flatulence. 07/10/20   Jovita KussmaulManess, Philip, MD    Allergies    Patient has no known allergies.  Review of Systems   Review of Systems  HENT: Positive  for sore throat.        Sore on lip  Respiratory: Positive for cough.   Gastrointestinal: Positive for abdominal pain.  All other systems reviewed and are negative.   Physical Exam Updated Vital Signs BP 112/83   Pulse (!) 119   Temp 98.9 F (37.2 C) (Oral)   Resp 19   SpO2 98%   Physical Exam Vitals and nursing note reviewed.  Constitutional:      Appearance: Normal appearance.  HENT:     Head: Normocephalic and atraumatic.     Comments: Small pustule to left lower lip    Right Ear: External ear normal.     Left Ear: External ear normal.     Nose: Nose normal.     Mouth/Throat:     Mouth: Mucous membranes are moist.     Pharynx: Oropharynx is clear.  Eyes:     Extraocular Movements: Extraocular movements intact.     Conjunctiva/sclera: Conjunctivae normal.     Pupils: Pupils are equal, round, and reactive to light.  Cardiovascular:     Rate and Rhythm: Regular rhythm. Tachycardia present.     Pulses: Normal pulses.      Heart sounds: Normal heart sounds.  Pulmonary:     Effort: Pulmonary effort is normal.     Breath sounds: Normal breath sounds.  Abdominal:     General: Abdomen is flat. Bowel sounds are normal.     Palpations: Abdomen is soft.     Comments: Mild dehiscence to her c-section wound.  Tenderness around wound.  Musculoskeletal:        General: Normal range of motion.     Cervical back: Normal range of motion and neck supple.  Skin:    General: Skin is warm.     Capillary Refill: Capillary refill takes less than 2 seconds.  Neurological:     General: No focal deficit present.     Mental Status: She is alert and oriented to person, place, and time.  Psychiatric:        Mood and Affect: Mood normal.        Behavior: Behavior normal.        Thought Content: Thought content normal.        Judgment: Judgment normal.     ED Results / Procedures / Treatments   Labs (all labs ordered are listed, but only abnormal results are displayed) Labs Reviewed  BASIC METABOLIC PANEL - Abnormal; Notable for the following components:      Result Value   Glucose, Bld 107 (*)    All other components within normal limits  RESP PANEL BY RT-PCR (FLU A&B, COVID) ARPGX2  GROUP A STREP BY PCR  CBC WITH DIFFERENTIAL/PLATELET  URINALYSIS, ROUTINE W REFLEX MICROSCOPIC  I-STAT BETA HCG BLOOD, ED ([facility], [facility], [facility] ONLY)    EKG None  Radiology CT ABDOMEN PELVIS WO CONTRAST  Result Date: 07/26/2020 CLINICAL DATA:  Abdominal pain, lower abdominal pain in a 30 year old female. Recently post C-section on 07/07/2020. EXAM: CT ABDOMEN AND PELVIS WITHOUT CONTRAST TECHNIQUE: Multidetector CT imaging of the abdomen and pelvis was performed following the standard protocol without IV contrast. COMPARISON:  Ultrasound, Ob ultrasound from May 26, 2020 FINDINGS: Lower chest: Incidental imaging of the lung bases without effusion or sign of consolidative changes. Hepatobiliary: No focal lesion on noncontrast imaging. Liver  contour is smooth without hepatic enlargement. No pericholecystic stranding. Pancreas: Normal contour without signs of adjacent inflammation. Spleen: Spleen normal in size and contour. Adrenals/Urinary  Tract: Adrenal glands are normal. Smooth contour the bilateral kidneys. No hydronephrosis. Urinary bladder collapsed with globular appearing uterus immediately above the urinary bladder. No nephrolithiasis or ureteral dilation. Stomach/Bowel: Stomach grossly unremarkable. No small bowel dilation. No stranding adjacent to small bowel loops aside from small bowel loops adjacent to the uterus. Colon is unremarkable. Visualized portions of the appendix are normal. Tip not definitively seen but other areas without any signs of inflammation, small and gas-filled. Vascular/Lymphatic: Vascular structures show normal caliber. There is no gastrohepatic or hepatoduodenal ligament lymphadenopathy. No retroperitoneal or mesenteric lymphadenopathy. No pelvic sidewall lymphadenopathy. Reproductive: Globular appearance of the uterus with some surrounding stranding. Adjacent bowel loops difficult to separate from the uterus No gross focal fluid collection. Minimal low-density in the area of the LEFT posterior uterus potentially representing ovary but not clearly delineated due to lack of contrast and loss of tissue planes following recent surgery may measure as much as 3.5 x 3.2 cm. IUD in place appears slightly canted with respect position though this could be in part due to the uterine position on the current study. It is contained within the myometrium and appears to be within the endometrial canal. Other: No signs of free air. Musculoskeletal: No acute musculoskeletal process. No destructive bone finding. IMPRESSION: 1. Globular appearance of the uterus with some surrounding stranding, recently postoperative from C-section is nonspecific in the postpartum setting. Correlate with any clinical signs of endometritis and consider  follow-up ultrasound as warranted. 2. Low-density area in the LEFT parametrium or adnexa likely ovary given lack of fluid between urinary bladder and uterus. Small fluid collection in this area is possible. 3. No sign of fluid collection between urinary bladder and uterus or evidence of substantial hematoma. No fluid collection in the anterior abdominal wall. 4. IUD within the endometrial canal mildly canted though potentially related to uterine position. Attention on follow-up. Electronically Signed   By: Donzetta Kohut M.D.   On: 07/26/2020 17:21    Procedures .Marland KitchenIncision and Drainage  Date/Time: 07/26/2020 8:35 PM Performed by: Jacalyn Lefevre, MD Authorized by: Jacalyn Lefevre, MD   Consent:    Consent obtained:  Verbal   Consent given by:  Patient   Risks discussed:  Bleeding and incomplete drainage   Alternatives discussed:  No treatment Universal protocol:    Patient identity confirmed:  Verbally with patient Location:    Type:  Abscess   Size:  0.5   Location:  Head   Head location:  Face Pre-procedure details:    Skin preparation:  Povidone-iodine Sedation:    Sedation type:  None Anesthesia:    Anesthesia method:  Topical application   Topical anesthetic:  LET Procedure type:    Complexity:  Simple Procedure details:    Incision types:  Stab incision   Drainage:  Purulent   Drainage amount:  Scant   Packing materials:  None Post-procedure details:    Procedure completion:  Tolerated well, no immediate complications     Medications Ordered in ED Medications  lidocaine-EPINEPHrine-tetracaine (LET) topical gel (3 mLs Topical Given 07/26/20 2003)  HYDROcodone-acetaminophen (NORCO/VICODIN) 5-325 MG per tablet 1 tablet (1 tablet Oral Given 07/26/20 1957)  ibuprofen (ADVIL) tablet 600 mg (600 mg Oral Given 07/26/20 1957)    ED Course  I have reviewed the triage vital signs and the nursing notes.  Pertinent labs & imaging results that were available during my care of  the patient were reviewed by me and considered in my medical decision making (see chart for details).  MDM Rules/Calculators/A&P                          Pt has an appt with the dermatologist about her lip.  If it does not go away after the I&D and abx, she will need to keep that appt.  Pt will be started on bactrim for the lip abscess and for the mild cellulitis around her wound.   Final Clinical Impression(s) / ED Diagnoses Final diagnoses:  Lip abscess  Dehiscence of operative wound, initial encounter    Rx / DC Orders ED Discharge Orders         Ordered    sulfamethoxazole-trimethoprim (BACTRIM DS) 800-160 MG tablet  2 times daily        07/26/20 2034    ibuprofen (ADVIL) 600 MG tablet  Every 6 hours PRN        07/26/20 2049           Jacalyn Lefevre, MD 07/26/20 2049

## 2020-07-26 NOTE — ED Provider Notes (Signed)
Emergency Medicine Provider Triage Evaluation Note  Ruth Castillo de Ruth Castillo 30 y.o. female was evaluated in triage.  Pt complains of pain to her C-section wound.  She reports about 20 days ago, she had a C-section.  She states that the wound started opening up.  Last night, she started noticing some white drainage from the wound.  She dates the area has been painful.  She denies any fevers but has had chills.  No nausea/vomiting, urinary complaints.   Review of Systems  Positive: Wound, chills Negative: Fever, nausea/vomiting, urinary complaints.  Physical Exam  BP 134/82   Pulse 70   Temp 98.2 F (36.8 C) (Oral)   Resp 18   Ht 5\' 4"  (1.626 m)   Wt 65.8 kg   SpO2 100%   BMI 24.89 kg/m  Gen:   Awake, no distress   HEENT:  Atraumatic  Resp:  Normal effort  Cardiac:  Normal rate  Abd:   Nondistended, wound noted to lower abdomen.  There is a small opening towards the right side.  No active drainage.  No rigidity, guarding.  There is some mild surrounding erythema.  No warmth. MSK:   Moves extremities without difficulty Neuro:  Speech clear  Other:     Medical Decision Making  Medically screening exam initiated at 3:58 PM  Appropriate orders placed.  de Arsenio Katz was informed that the remainder of the evaluation will be completed by another provider, this initial triage assessment does not replace that evaluation. They are counseled that they will need to remain in the ED until the completion of their workup, including full H&P and results of any tests.  Risks of leaving the emergency department prior to completion of treatment were discussed. Patient was advised to inform ED staff if they are leaving before their treatment is complete. The patient acknowledged these risks and time was allowed for questions.     The patient appears stable so that the remainder of the MSE may be completed by another provider.   Clinical Impression  abd pain, wound opening   Portions of this  note were generated with Dragon dictation software. Dictation errors may occur despite best attempts at proofreading.      Ruth Meier, PA-C 07/26/20 1600    07/28/20, MD 07/26/20 [phone]

## 2020-07-26 NOTE — ED Triage Notes (Signed)
Patient coming from home. Complaint of open wound after c section 20 days ago. Reports white drainage last night. VSS. NAD

## 2020-07-26 NOTE — ED Notes (Signed)
E-signature pad unavailable at time of pt discharge. This RN discussed discharge materials with pt and answered all pt questions. Pt stated understanding of discharge material. ? ?

## 2020-07-27 ENCOUNTER — Telehealth: Payer: Self-pay

## 2020-07-27 ENCOUNTER — Other Ambulatory Visit: Payer: Self-pay | Admitting: Obstetrics and Gynecology

## 2020-07-27 ENCOUNTER — Telehealth (HOSPITAL_COMMUNITY): Payer: Self-pay

## 2020-07-27 NOTE — Telephone Encounter (Signed)
Called to discuss with patient about COVID-19 symptoms and the use of one of the available treatments for those with mild to moderate Covid symptoms and at a high risk of hospitalization.  Pt appears to qualify for outpatient treatment due to co-morbid conditions and/or a member of an at-risk group in accordance with the FDA Emergency Use Authorization.    Symptom onset: Cough,sore throat Vaccinated: Unknown Booster? Unknown Immunocompromised? No Qualifiers: Obesity NIH Criteria: Tier 1 Unable to reach pt - Unable to leave message.   Esther Hardy

## 2020-07-28 ENCOUNTER — Inpatient Hospital Stay (HOSPITAL_COMMUNITY)
Admission: AD | Admit: 2020-07-28 | Payer: Medicaid Other | Source: Home / Self Care | Admitting: Obstetrics and Gynecology

## 2020-08-05 ENCOUNTER — Inpatient Hospital Stay (HOSPITAL_COMMUNITY)
Admission: AD | Admit: 2020-08-05 | Discharge: 2020-08-05 | Disposition: A | Discharge status: Home / Self Care | Payer: Medicaid Other | Attending: Obstetrics and Gynecology | Admitting: Obstetrics and Gynecology

## 2020-08-05 ENCOUNTER — Encounter (HOSPITAL_COMMUNITY): Payer: Self-pay | Admitting: Obstetrics and Gynecology

## 2020-08-05 ENCOUNTER — Inpatient Hospital Stay (HOSPITAL_COMMUNITY): Payer: Medicaid Other

## 2020-08-05 ENCOUNTER — Other Ambulatory Visit: Payer: Self-pay

## 2020-08-05 DIAGNOSIS — O8612 Endometritis following delivery: Secondary | ICD-10-CM | POA: Diagnosis not present

## 2020-08-05 DIAGNOSIS — Z8616 Personal history of COVID-19: Secondary | ICD-10-CM | POA: Diagnosis not present

## 2020-08-05 DIAGNOSIS — R109 Unspecified abdominal pain: Secondary | ICD-10-CM | POA: Insufficient documentation

## 2020-08-05 LAB — CBC
HCT: 39.2 % (ref 36.0–46.0)
Hemoglobin: 12.6 g/dL (ref 12.0–15.0)
MCH: 26.6 pg (ref 26.0–34.0)
MCHC: 32.1 g/dL (ref 30.0–36.0)
MCV: 82.7 fL (ref 80.0–100.0)
Platelets: 303 10*3/uL (ref 150–400)
RBC: 4.74 MIL/uL (ref 3.87–5.11)
RDW: 15.3 % (ref 11.5–15.5)
WBC: 5.1 10*3/uL (ref 4.0–10.5)
nRBC: 0 % (ref 0.0–0.2)

## 2020-08-05 MED ORDER — IOHEXOL 300 MG/ML  SOLN
75.0000 mL | Freq: Once | INTRAMUSCULAR | Status: AC | PRN
  Administered 2020-08-05: 75 mL via INTRAVENOUS

## 2020-08-05 MED ORDER — IOHEXOL 9 MG/ML PO SOLN
ORAL | Status: AC
  Administered 2020-08-05: 500 mL
  Filled 2020-08-05: qty 1000

## 2020-08-05 MED ORDER — SILVER NITRATE-POT NITRATE 75-25 % EX MISC
CUTANEOUS | Status: AC
  Administered 2020-08-05: 1 via TOPICAL
  Filled 2020-08-05: qty 10

## 2020-08-05 MED ORDER — SILVER NITRATE-POT NITRATE 75-25 % EX MISC: 1.0000 "application " | Freq: Once | CUTANEOUS | Status: AC

## 2020-08-05 MED ORDER — AMOXICILLIN-POT CLAVULANATE 875-125 MG PO TABS: 1.0000 | ORAL_TABLET | Freq: Two times a day (BID) | ORAL | 0 refills | Status: AC

## 2020-08-05 NOTE — MAU Note (Signed)
Pt given instruction on how to drink solution by Luna Kitchens, CNM. First bottle started at 1817.

## 2020-08-05 NOTE — MAU Provider Note (Signed)
Patient Ruth Castillo de Ruth Castillo is a 30 y.o. [id number]  at 1 month postpartum here with complaints of abdominal pain that started on 5/22. She was seen in [facility] and had a CT scan that was negative for hematoma or infection. She was given RX for Bactrim, which she took half of (was confused about how many pills to take during the day.)  She denies vaginal pain, vaginal bleeding, foul-smelling discharge. She denies dysuria, difficulties with BM.   She had an IUD placed during surgery.   She was also found to be COVID positive on May 22.   History     CSN: [id number]  Arrival date and time: 08/05/20 1543   Event Date/Time   First Provider Initiated Contact with Patient 08/05/20 1648      Chief Complaint  Patient presents with  . Abdominal Pain  . Post-op Problem   Abdominal Pain This is a new problem. The current episode started in the past 7 days. The problem occurs constantly. Pain location: above her incision site. The pain is at a severity of 4/10. The quality of the pain is sharp.    OB History    Gravida  3   Para  1   Term      Preterm  1   AB  1   Living  2     SAB  1   IAB      Ectopic      Multiple  0   Live Births  1           Past Medical History:  Diagnosis Date  . Anemia   . Stroke [facility])    mild per pt    Past Surgical History:  Procedure Laterality Date  . CESAREAN SECTION    . CESAREAN SECTION N/A 07/07/2020   Procedure: CESAREAN SECTION;  Surgeon: Ruth Hidalgo, DO;  Location: [facility] LD [facility]S;  Service: Obstetrics;  Laterality: N/A;    History reviewed. No pertinent family history.  Social History   Tobacco Use  . Smoking status: Never Smoker  . Smokeless tobacco: Never Used  Vaping Use  . Vaping Use: Never used  Substance Use Topics  . Alcohol use: Never  . Drug use: Never    Allergies: No Known Allergies  Medications Prior to Admission  Medication Sig Dispense Refill Last Dose  . acetaminophen (TYLENOL) 325 MG tablet Take 2  tablets (650 mg total) by mouth every 4 (four) hours as needed for mild pain (temperature > 101.5.).     Marland Kitchen ferrous sulfate 325 (65 FE) MG tablet Take 1 tablet (325 mg total) by mouth every other day. 30 tablet 3   . ibuprofen (ADVIL) 600 MG tablet Take 1 tablet (600 mg total) by mouth every 6 (six) hours. 30 tablet 0   . ibuprofen (ADVIL) 600 MG tablet Take 1 tablet (600 mg total) by mouth every 6 (six) hours as needed. 30 tablet 0   . simethicone (MYLICON) 80 MG chewable tablet Chew 1 tablet (80 mg total) by mouth as needed for flatulence. 30 tablet 0     Review of Systems  HENT: Negative.   Eyes: Negative.   Respiratory: Negative.   Gastrointestinal: Positive for abdominal pain.  Genitourinary: Negative for vaginal bleeding, vaginal discharge and vaginal pain.  Allergic/Immunologic: Negative.   Neurological: Negative.    Physical Exam   Blood pressure 116/68, pulse 85, temperature 98.7 F (37.1 C), temperature source Oral, resp. rate 12, SpO2 95 %, currently breastfeeding.  Physical Exam Constitutional:      Appearance: She is well-developed.  HENT:     Mouth/Throat:     Mouth: Mucous membranes are moist.  Abdominal:     General: Abdomen is flat.     Palpations: Abdomen is soft.     Tenderness: There is abdominal tenderness.     Comments: Incision is clean, dry, well-approximated. Small are (0.5 cm) of dehiscence on patient's right side; no pus, no redness, no tunneling with q-tip, no foul odor or discharge.   Skin:    General: Skin is warm.  Neurological:     Mental Status: She is alert.   Breast are soft, non-tender, no streaking, no signs of mastitis.   MAU Course  Procedures  MDM -Dr. Earlene Castillo at bedside to examine patient; recommends CT scan to rule out hematoma/infection -CT shows no evidence of infection/hematoma/absess -silver nitrate applied over small area of dehisence.  -patient's CBC normal, no white count -patient is afebrile Patient Vitals for the past 24  hrs:  BP Temp Temp src Pulse Resp SpO2  08/05/20 1703 116/68 98.7 F (37.1 C) Oral 85 12 95 %    Assessment and Plan   1. Postpartum endometritis   -Patient to keep follow up PP visit at [facility] on 6/14 -Return to MAU if fever, foul-odor,  -RX for Augmentin sent to pharmacy on file, explained how to take and emphasized importance of taking abx as prescribed  Ruth Castillo 08/05/2020, 9:19 PM

## 2020-08-05 NOTE — Discharge Instructions (Signed)
Ferri's Clinical Advisor 2018 (pp. [phone]). Elsevier.">  Endometritis Endometritis  La endometritis es irritacin, dolor e inflamacin en la membrana que reviste el tero (endometrio). Con frecuencia es causada por una infeccin. Es vital realizar un tratamiento para evitar complicaciones. Las complicaciones comunes pueden incluir infecciones ms graves y no poder concebir hijos(infertilidad). Cules son las causas? Esta afeccin puede ser causada por lo siguiente:  Infecciones bacterianas.  ITS (infecciones de transmisin sexual).  Un aborto o un parto, especialmente despus de un trabajo de parto prolongado o un parto por cesrea.  Ciertos procedimientos que afectan el aparato reproductor femenino. Estos pueden incluir dilatacin y curetaje (D&C), histeroscopia o insercin de un mtodo anticonceptivo.  Tuberculosis (TB). Qu incrementa el riesgo? Los siguientes factores pueden hacer que sea ms propensa a Aeronautical engineer afeccin:  Tener mltiples parejas sexuales.  No usar proteccin de barrera [employer] [place] [employer] sexuales, como un preservativo.  Actividad sexual antes de los 16 aos de edad. Cules son los signos o sntomas? Los sntomas de esta afeccin incluyen:  [place].  Dolor en la zona inferior del abdomen.  Dolor en la zona que est [employer] de la cadera (pelvis).  Secrecin vaginal o sangrado que no es normal.  Distensin o hinchazn abdominal.  Sensacin general de malestar o cansancio.  Molestias durante las deposiciones o estreimiento. Cmo se diagnostica? Esta afeccin se puede diagnosticar en funcin de lo siguiente:  Un examen fsico, incluido un examen plvico.  Estudios, como, por ejemplo: ? Anlisis de [place]. ? Extraccin de [place] de tejido del endometrio para anlisis (biopsia endometrial). ? Examen de Lauris Poag de secrecin vaginal en el microscopio (preparado fresco). ? Extraccin de [place] de lquido del cuello  uterino para anlisis (cultivo cervical). ? Examen quirrgico de la pelvis y del abdomen. Cmo se trata? Esta afeccin se trata con antibiticos. En los casos ms graves, los lquidos y antibiticos pueden administrarse por Occupational hygienist hospital. Siga estas instrucciones en su casa: Medicamentos  Use los medicamentos de venta libre y los recetados solamente como se lo haya indicado el mdico.  Tome su antibitico como se lo haya indicado el mdico. No deje de tomar el antibitico aunque comience a sentirse mejor. Actividad  No se realice duchas vaginales, ni tenga relaciones sexuales hasta que el mdico le indique que es seguro.  Si esta afeccin fue causada por una ITS, notenga relaciones sexuales hasta que su pareja tambin haya recibido tratamiento para la ITS.  Retome sus actividades normales segn lo indicado por el mdico. Pregntele al mdico qu actividades son seguras para usted. Indicaciones generales  Beba suficiente lquido como para mantener la orina de color amarillo plido.  Cumpla con todas las visitas de seguimiento. Esto es importante. Comunquese con un mdico si:  Su dolor no se alivia con medicamentos.  Tiene fiebre.  Siente dolor al mover el intestino. Solicite ayuda de inmediato si:  El abdomen se hincha.  Tiene dolor abdominal que empeora.  Tiene una secrecin vaginal con mal olor o ha aumentado la cantidad de Midwife.  Tiene sangrado vaginal que no es normal.  Tiene nuseas y vmitos. Resumen  La endometritis afecta el revestimiento del tero (endometrio). Con frecuencia es causada por una infeccin.  Es vital realizar un tratamiento para evitar complicaciones. El tratamiento puede incluir el uso de antibiticos y lquidos por va intravenosa.  Tome su antibitico como se lo haya indicado el mdico. No deje de tomar el antibitico aunque comience a sentirse mejor.  No se realice duchas vaginales, ni tenga relaciones sexuales hasta  que el mdico le indique que es seguro. Esta informacin no tiene Theme park manager el consejo del mdico. Asegrese de hacerle al mdico cualquier pregunta que tenga. Document Revised: 10/30/2019 Document Reviewed: 10/30/2019 Elsevier Patient Education  2021 [employer].

## 2020-08-05 NOTE — MAU Note (Signed)
C/s 5/3. Pain at incision site. Had some drainage a couple wks ago, was seen in the ER, was given antibiotics.  Did not take correctly, was confused, took 1/d instead of 2, was discovered today when she received a call from a nurse to check on her incision. Told them "she was still open", so she was brought in to the health dept, they sent her here.  No problems with urination or bowel movements. Denies fever.  covid + 2 wks ago, is feeling better, no resp problems.

## 2020-08-10 ENCOUNTER — Inpatient Hospital Stay (HOSPITAL_COMMUNITY)
Admission: AD | Admit: 2020-08-10 | Discharge: 2020-08-10 | Disposition: A | Discharge status: Home / Self Care | Payer: Medicaid Other | Attending: Obstetrics and Gynecology | Admitting: Obstetrics and Gynecology

## 2020-08-10 ENCOUNTER — Other Ambulatory Visit: Payer: Self-pay

## 2020-08-10 ENCOUNTER — Telehealth: Payer: Self-pay | Admitting: Family Medicine

## 2020-08-10 ENCOUNTER — Inpatient Hospital Stay (HOSPITAL_COMMUNITY): Payer: Medicaid Other

## 2020-08-10 DIAGNOSIS — O8612 Endometritis following delivery: Secondary | ICD-10-CM

## 2020-08-10 DIAGNOSIS — O900 Disruption of cesarean delivery wound: Secondary | ICD-10-CM | POA: Diagnosis not present

## 2020-08-10 DIAGNOSIS — N719 Inflammatory disease of uterus, unspecified: Secondary | ICD-10-CM

## 2020-08-10 DIAGNOSIS — T8130XA Disruption of wound, unspecified, initial encounter: Secondary | ICD-10-CM

## 2020-08-10 DIAGNOSIS — O99893 Other specified diseases and conditions complicating puerperium: Secondary | ICD-10-CM | POA: Diagnosis present

## 2020-08-10 DIAGNOSIS — L7682 Other postprocedural complications of skin and subcutaneous tissue: Secondary | ICD-10-CM

## 2020-08-10 LAB — CBC
HCT: 37.3 % (ref 36.0–46.0)
Hemoglobin: 11.8 g/dL — ABNORMAL LOW (ref 12.0–15.0)
MCH: 26.2 pg (ref 26.0–34.0)
MCHC: 31.6 g/dL (ref 30.0–36.0)
MCV: 82.9 fL (ref 80.0–100.0)
Platelets: 243 10*3/uL (ref 150–400)
RBC: 4.5 MIL/uL (ref 3.87–5.11)
RDW: 15.1 % (ref 11.5–15.5)
WBC: 5.3 10*3/uL (ref 4.0–10.5)
nRBC: 0 % (ref 0.0–0.2)

## 2020-08-10 MED ORDER — SILVER NITRATE-POT NITRATE 75-25 % EX MISC: 1.0000 | Freq: Once | CUTANEOUS | Status: AC

## 2020-08-10 MED ORDER — OXYCODONE-ACETAMINOPHEN 5-325 MG PO TABS
1.0000 | ORAL_TABLET | ORAL | Status: AC
Start: 2020-08-10 — End: 2020-08-10
  Administered 2020-08-10: 1 via ORAL
  Filled 2020-08-10: qty 1

## 2020-08-10 MED ORDER — OXYCODONE HCL 5 MG PO TABS: 5.0000 mg | ORAL_TABLET | Freq: Three times a day (TID) | ORAL | 0 refills | Status: AC | PRN

## 2020-08-10 MED ORDER — SILVER NITRATE-POT NITRATE 75-25 % EX MISC
CUTANEOUS | Status: AC
  Administered 2020-08-10: 1 via TOPICAL
  Filled 2020-08-10: qty 10

## 2020-08-10 NOTE — MAU Note (Signed)
Presents stating Cesarean Section incision is open.  Reports seen 4 days ago in MAU for same complaint.  Reports incision evaluated.  Reports incision draining blood tinged liquid.  S/P Cesarean 07/07/2020.

## 2020-08-10 NOTE — Discharge Instructions (Signed)
Dehiscencia de suturas Wound Dehiscence La dehiscencia de suturas ocurre cuando un corte quirrgico (una incisin) se abre y no se cura como debera. Este problema generalmente ocurre entre 7 y [address] despus de la [place]. Es posible que Materials engineer. Tambin puede sentir dolor o tener fiebre. Esta afeccin debe tratarse inmediatamente. Cules son las causas? Las causas ms frecuentes de esta afeccin incluyen las siguientes:  Furniture conservator/restorer la zona de la herida al: ? Lexicographer objetos. ? Vmitos. ? Toser con fuerza. ? Hacer fuerza al defecar (tener deposiciones).  Infeccin de heridas.  Extraccin temprana de los puntos (suturas) o el hecho de que los puntos se rompan o se aflojen. Qu incrementa el riesgo? Los siguientes factores pueden hacer que sea ms propenso a contraer esta afeccin:  Tener mucho sobrepeso (obesidad).  Una enfermedad pulmonar.  Fumar.  No comer la cantidad suficiente de alimentos saludables (dficit nutricional).  Ingreso de [employer] en la herida durante la [place].  Problemas mdicos que dificultan la cicatrizacin, como la diabetes.  Tomar ciertos medicamentos. Cules son los signos o sntomas? Los sntomas de esta afeccin incluyen:  Sangrado o supuracin de la herida.  Dolor.  Grant Ruts.  La herida que se abre.    Cmo se trata? El tratamiento para esta afeccin puede incluir lo siguiente:  Mantener la herida limpia.  Someterse a una ciruga para reparar la herida.  Tomar antibiticos para tratar o prevenir una infeccin.  Tomar medicamentos para Engineer, materials y reducir la hinchazn. Siga estas instrucciones en su casa: Medicamentos  Use los medicamentos de venta libre y los recetados solamente como se lo haya indicado el mdico.  Si el mdico se lo indica, tome un analgsico antes de Psychologist, educational venda (vendaje). Esto puede ayudar a Regulatory affairs officer.  Si le recetaron un antibitico, tmelo como se lo haya  indicado el mdico. No deje de usar el antibitico aunque comience a Actor.     Cuidados de la herida  No se toque ni se rasque la herida.  Controle la zona de la herida todos los [address]  [address] para detectar signos de infeccin. Est atento a los siguientes signos: ? Ms enrojecimiento, hinchazn o dolor alrededor de la herida. ? Ms lquido [place]. ? Calor. ? Pus o mal olor.    Actividad  Evite los ejercicios que lo hagan sudar o que podran estirar la herida.  No levante objetos que pesen ms de 10libras (4,5kg) hasta que la herida haya sanado o hasta que el mdico le diga que es seguro.    Instrucciones generales  No se d baos de inmersin, no nade ni use el jacuzzi hasta que el mdico lo apruebe. Pregunte al mdico si puede ducharse. Delle Reining solo le permitan darse baos de [place].  Concurra a todas las visitas de [address] se lo haya indicado el mdico. Esto es importante. Comunquese con un mdico si:  La herida no parece estar curndose correctamente.  Tiene fiebre. Solicite ayuda de inmediato si:  Tiene ms enrojecimiento, hinchazn o dolor alrededor de la herida.  Le sale ms lquido de la herida.  La herida o la zona alrededor de la herida se siente dura o caliente al tocarla.  Observa pus o percibe mal olor que salen de la herida.  La herida se abre ms.  Tiene lneas rojas que se propagan desde la herida.  Le sale mucha sangre de la herida. Resumen  La dehiscencia de suturas ocurre cuando un corte quirrgico (una  incisin) se abre y no se cura como debera.  Siga las instrucciones del mdico en lo que respecta al cuidado de la herida.  Controle la zona de la herida todos los [address]  [address] para detectar signos de infeccin. Estos signos pueden ser enrojecimiento, hinchazn, dolor, lquido, sangre, calor, pus o mal olor.  Si le recetaron un antibitico, tmelo como se lo haya indicado el mdico. No deje de usarlo aunque comience a sentirse  mejor.  Comunquese con un mdico si la herida no parece estar sanando correctamente. Esta informacin no tiene Theme park manager el consejo del mdico. Asegrese de hacerle al mdico cualquier pregunta que tenga. Document Revised: 04/01/2019 Document Reviewed: 04/01/2019 Elsevier Patient Education  2021 [employer].

## 2020-08-10 NOTE — Telephone Encounter (Signed)
Called by nurseline.  They saw patient and report she has had several days of worsening discharge from her wound, no red flags for them but per protocol contacted MD.  I called patient and spoke to her directly, also reviewed the chart. Patient last seen on 08/05/20 for small wound dehisence, CT A/P unremarkable, exam unremarkable with no tunneling or purulent discharge. She was started on Augmentin, silver nitrate was applied to the wound, and she was discharged.  She reports that since then she has had a large amount of serosanguinous discharge from the wound and some reddening of the skin around it, unclear from our conversation if it is indurated. Denies fevers or n/v. Her next visit is over a week from now for her PP visit at the [facility].   Given lack of improvement in symptoms and increased drainage will have patient come to MAU for evaluation, suspect non-infected hematoma but impossible to know over the phone. Patient in agreement with this plan. MAU provider notified.

## 2020-08-10 NOTE — MAU Provider Note (Signed)
History     CSN: [id number]  Arrival date and time: 08/10/20 1437   None     Chief Complaint  Patient presents with  . Incision Check   HPI   Patient is a [id number] who is 4 weeks postpartum from a repeat cesarean section w iud insertion presenting for evaluation of incisional pain and drainage. This is the third time she has been evaluated for this complaint. She was first seen on 5/22 and had negative CT scan and was given script for Bactrim. She was confused about the dosing and only took 50% of medication. She was subsequently seen on 6/1 where she again had a negative CT scan. This time she was discharged on augmentin for endometritis, which she is still taking. At time of both exams she was afebrile without leukocytosis or other signs concerning for infection.   She reports ongoing incisional pain and thinks that the wound has opened further. She reports that she has been cleaning the incision site multiple times daily with an alcohol pad.   Denies fevers, nausea, vomiting, or chills. Baby is doing well, she is breast feeding.     OB History    Gravida  3   Para  1   Term      Preterm  1   AB  1   Living  2     SAB  1   IAB      Ectopic      Multiple  0   Live Births  1           Past Medical History:  Diagnosis Date  . Anemia   . Stroke [facility])    mild per pt    Past Surgical History:  Procedure Laterality Date  . CESAREAN SECTION    . CESAREAN SECTION N/A 07/07/2020   Procedure: CESAREAN SECTION;  Surgeon: Myna Hidalgo, DO;  Location: [facility] LD ORS;  Service: Obstetrics;  Laterality: N/A;    No family history on file.  Social History   Tobacco Use  . Smoking status: Never Smoker  . Smokeless tobacco: Never Used  Vaping Use  . Vaping Use: Never used  Substance Use Topics  . Alcohol use: Never  . Drug use: Never    Allergies: No Known Allergies  Medications Prior to Admission  Medication Sig Dispense Refill Last Dose  . acetaminophen  (TYLENOL) 325 MG tablet Take 2 tablets (650 mg total) by mouth every 4 (four) hours as needed for mild pain (temperature > 101.5.).   08/10/2020 at 0900  . amoxicillin-clavulanate (AUGMENTIN) 875-125 MG tablet Take 1 tablet by mouth 2 (two) times daily for 7 days. 14 tablet 0 08/10/2020 at 0900  . ibuprofen (ADVIL) 600 MG tablet Take 1 tablet (600 mg total) by mouth every 6 (six) hours. 30 tablet 0 08/10/2020 at 0900  . simethicone (MYLICON) 80 MG chewable tablet Chew 1 tablet (80 mg total) by mouth as needed for flatulence. 30 tablet 0 08/09/2020  . ferrous sulfate 325 (65 FE) MG tablet Take 1 tablet (325 mg total) by mouth every other day. 30 tablet 3   . ibuprofen (ADVIL) 600 MG tablet Take 1 tablet (600 mg total) by mouth every 6 (six) hours as needed. 30 tablet 0     Review of Systems  Constitutional: Negative for activity change, chills and fever.  Cardiovascular: Negative for chest pain.  Gastrointestinal: Negative for abdominal pain, diarrhea, nausea and vomiting.  Musculoskeletal: Negative for back pain.  Neurological: Negative  for dizziness.   Physical Exam   Blood pressure 123/76, pulse 78, temperature 98 F (36.7 C), resp. rate 18, weight 78.3 kg, SpO2 99 %, currently breastfeeding.  Physical Exam Vitals and nursing note reviewed. Exam conducted with a chaperone present.  Cardiovascular:     Rate and Rhythm: Normal rate.     Pulses: Normal pulses.  Pulmonary:     Effort: Pulmonary effort is normal.  Abdominal:     Palpations: Abdomen is soft.     Comments: Incision with 1cm area of dehiscence, no purulent discharge. Does not probe deep or tunnel. No surrounding erythema. Exquisitely tender to touch. Fundus is firm, tender to palpation but no guarding or rebound.   Psychiatric:        Mood and Affect: Mood normal.        Behavior: Behavior normal.     MAU Course  Procedures  MDM  Pt evaluated at bedside. Also evaluated by CNM who saw patient a few days ago. Dehiscence area  is slightly larger than before. Wound does not probe deeper or tunnel. No purulent discharge.  Afebrile  Will obtain wound culture, cbc Pelvic US and wound [place] - both are normal and do not show any signs concerning for infection. IUD is placed correctly.  WBC 5.3 Silver nitrate applied to wound and steri strips applied   Assessment and Plan   Post operative state, wound dehiscence -continue augmentin as prescribed for endometritis  -follow up wound cultures, exam overall reassuring and [place] is normal. Discussed wound care with patient and her partner with use of in person interpreter  -stable for dc home   Gita Kudo 08/10/2020, 6:18 PM

## 2020-08-13 LAB — AEROBIC CULTURE W GRAM STAIN (SUPERFICIAL SPECIMEN)
Culture: NO GROWTH
Gram Stain: NONE SEEN

## 2020-09-02 ENCOUNTER — Encounter (HOSPITAL_COMMUNITY): Payer: Self-pay

## 2020-09-02 ENCOUNTER — Other Ambulatory Visit: Payer: Self-pay

## 2020-09-02 ENCOUNTER — Ambulatory Visit (HOSPITAL_COMMUNITY)
Admission: EM | Admit: 2020-09-02 | Discharge: 2020-09-02 | Disposition: A | Discharge status: Home / Self Care | Payer: Medicaid Other | Attending: Urgent Care | Admitting: Urgent Care

## 2020-09-02 DIAGNOSIS — M778 Other enthesopathies, not elsewhere classified: Secondary | ICD-10-CM

## 2020-09-02 DIAGNOSIS — M25531 Pain in right wrist: Secondary | ICD-10-CM

## 2020-09-02 MED ORDER — NAPROXEN 375 MG PO TABS: 375.0000 mg | ORAL_TABLET | Freq: Two times a day (BID) | ORAL | 0 refills | Status: AC

## 2020-09-02 NOTE — ED Provider Notes (Signed)
[facility]   MRN: [id number] DOB: November 12, 1990  Subjective:   Ruth Castillo is a 30 y.o. female presenting for 26-month history of persistent intermittent right wrist pain, stiffness.  Patient states that symptoms started after she had an iron infusion done on her right forearm.  Since then she has had these intermittent symptoms.  This current episode has been over the past 2 weeks.  She was using some ibuprofen but then stopped because she ran out of the medication.  Denies falls, trauma, history of musculoskeletal disorders.  She has used NSAIDs in the past without any issues.  She is 2 months postpartum, is breast-feeding.  No current facility-administered medications for this encounter.  Current Outpatient Medications:    acetaminophen (TYLENOL) 325 MG tablet, Take 2 tablets (650 mg total) by mouth every 4 (four) hours as needed for mild pain (temperature > 101.5.)., Disp: , Rfl:    ferrous sulfate 325 (65 FE) MG tablet, Take 1 tablet (325 mg total) by mouth every other day., Disp: 30 tablet, Rfl: 3   ibuprofen (ADVIL) 600 MG tablet, Take 1 tablet (600 mg total) by mouth every 6 (six) hours., Disp: 30 tablet, Rfl: 0   ibuprofen (ADVIL) 600 MG tablet, Take 1 tablet (600 mg total) by mouth every 6 (six) hours as needed., Disp: 30 tablet, Rfl: 0   oxyCODONE (ROXICODONE) 5 MG immediate release tablet, Take 1 tablet (5 mg total) by mouth every 8 (eight) hours as needed., Disp: 10 tablet, Rfl: 0   simethicone (MYLICON) 80 MG chewable tablet, Chew 1 tablet (80 mg total) by mouth as needed for flatulence., Disp: 30 tablet, Rfl: 0   No Known Allergies  Past Medical History:  Diagnosis Date   Anemia    Stroke (HCC)    mild per pt     Past Surgical History:  Procedure Laterality Date   CESAREAN SECTION     CESAREAN SECTION N/A 07/07/2020   Procedure: CESAREAN SECTION;  Surgeon: Myna Hidalgo, DO;  Location: [facility];  Service: Obstetrics;  Laterality: N/A;     History reviewed. No pertinent family history.  Social History   Tobacco Use   Smoking status: Never   Smokeless tobacco: Never  Vaping Use   Vaping Use: Never used  Substance Use Topics   Alcohol use: Never   Drug use: Never    ROS   Objective:   Vitals: BP 94/60   Pulse 91   Temp 98.2 F (36.8 C)   Resp 18   LMP 11/06/2019 (Approximate) Comment: recent pregnancy  SpO2 98%   Breastfeeding Yes   Physical Exam Constitutional:      General: She is not in acute distress.    Appearance: Normal appearance. She is well-developed. She is not ill-appearing, toxic-appearing or diaphoretic.  HENT:     Head: Normocephalic and atraumatic.     Nose: Nose normal.     Mouth/Throat:     Mouth: Mucous membranes are moist.     Pharynx: Oropharynx is clear.  Eyes:     General: No scleral icterus.    Extraocular Movements: Extraocular movements intact.     Pupils: Pupils are equal, round, and reactive to light.  Cardiovascular:     Rate and Rhythm: Normal rate.  Pulmonary:     Effort: Pulmonary effort is normal.  Musculoskeletal:     Right forearm: No swelling, edema, deformity, lacerations, tenderness or bony tenderness.     Right wrist: No swelling,  deformity, effusion, lacerations, tenderness, bony tenderness, snuff box tenderness or crepitus. Normal range of motion.     Right hand: No swelling, deformity, lacerations, tenderness or bony tenderness. Normal range of motion. Normal strength. Normal capillary refill.       Hands:  Skin:    General: Skin is warm and dry.  Neurological:     General: No focal deficit present.     Mental Status: She is alert and oriented to person, place, and time.  Psychiatric:        Mood and Affect: Mood normal.        Behavior: Behavior normal.        Thought Content: Thought content normal.        Judgment: Judgment normal.      Assessment and Plan :   PDMP not reviewed this encounter.  1. Right wrist tendonitis   2. Right  wrist pain     As a result trauma, physical exam findings warranting an x-ray recommended deferring this for now.  Suspect inflammatory process related to overuse.  Applied a 2 inch Ace wrap to the right wrist.  Start naproxen for pain and inflammation.  Follow-up with Ortho. Counseled patient on potential for adverse effects with medications prescribed/recommended today, ER and return-to-clinic precautions discussed, patient verbalized understanding.    Wallis Bamberg, [place] 09/02/20 1232

## 2020-09-02 NOTE — ED Triage Notes (Addendum)
Pt reports that since having her baby 2 months ago she has been experiencing pain in her right hand. Reports pain got worse 15 days ago. States during her C section she had her IV in this hand and states that iron infusion went out of the IV. Some discoloration noted and reports previously had inflammation and liquid under skin. Denies numbness at this time but endorses cold/strange feeling in hand/wrist.

## 2021-07-23 ENCOUNTER — Other Ambulatory Visit: Payer: Self-pay

## 2021-07-23 ENCOUNTER — Encounter (HOSPITAL_COMMUNITY): Payer: Self-pay | Admitting: Emergency Medicine

## 2021-07-23 ENCOUNTER — Emergency Department (HOSPITAL_COMMUNITY)
Admission: EM | Admit: 2021-07-23 | Discharge: 2021-07-23 | Disposition: A | Discharge status: Home / Self Care | Payer: Medicaid Other | Attending: Emergency Medicine | Admitting: Emergency Medicine

## 2021-07-23 DIAGNOSIS — L292 Pruritus vulvae: Secondary | ICD-10-CM | POA: Diagnosis present

## 2021-07-23 DIAGNOSIS — N898 Other specified noninflammatory disorders of vagina: Secondary | ICD-10-CM

## 2021-07-23 LAB — URINALYSIS, ROUTINE W REFLEX MICROSCOPIC
Bilirubin Urine: NEGATIVE
Glucose, UA: NEGATIVE mg/dL
Hgb urine dipstick: NEGATIVE
Ketones, ur: 5 mg/dL — AB
Leukocytes,Ua: NEGATIVE
Nitrite: NEGATIVE
Protein, ur: NEGATIVE mg/dL
Specific Gravity, Urine: 1.029 (ref 1.005–1.030)
pH: 5 (ref 5.0–8.0)

## 2021-07-23 LAB — WET PREP, GENITAL
Clue Cells Wet Prep HPF POC: NONE SEEN
Sperm: NONE SEEN
Trich, Wet Prep: NONE SEEN
WBC, Wet Prep HPF POC: <10 (ref <10)
Yeast Wet Prep HPF POC: NONE SEEN

## 2021-07-23 MED ORDER — FLUCONAZOLE 200 MG PO TABS: 200.0000 mg | ORAL_TABLET | Freq: Every day | ORAL | 0 refills | Status: AC

## 2021-07-23 NOTE — Discharge Instructions (Signed)
Your test today did not show an obvious infection causing your itching.  As Diflucan has helped with this in the past, you can take 1 dose today, recommend following up with your gynecologist on Monday as scheduled.  Discontinue use of your current, recommend Dove.  Additional tests have been added to your work-up and will be available in your MyChart account in the next 3 to 5 days.  Su prueba de hoy no mostr una infeccin obvia que le causara picazn. [place] Diflucan ha ayudado con esto en el pasado, puede tomar 1 dosis hoy, le recomendamos que haga un seguimiento con su gineclogo el lunes segn lo programado. Deje de usar su actual, recomiende Oriska. Se agregaron pruebas adicionales a su trabajo y estarn disponibles en su cuenta MyChart en los prximos 3 a 5 das.

## 2021-07-23 NOTE — ED Provider Notes (Signed)
[facility] EMERGENCY DEPARTMENT Provider Note   CSN: [id number] Arrival date & time: 07/23/21  1056     History  Chief Complaint  Patient presents with   Vaginal Itching    Ruth Castillo is a 31 y.o. female.  31 year old female presents with complaint of vaginal itching x 4 days, history of recurrent problem that has been treated with a pill previously (take 1 pill on day 1, repeat dose 4 days later). Scheduled to see GYN Monday but itching is intense.  Denies abnormal discharge. Does use a feminine wash product. Has not had recent STI testing. Is not breastfeeding  Has IUD   A language interpreter was used Hydrologist).  Vaginal Itching      Home Medications Prior to Admission medications   Medication Sig Start Date End Date Taking? Authorizing Provider  fluconazole (DIFLUCAN) 200 MG tablet Take 1 tablet (200 mg total) by mouth daily for 1 day. 07/23/21 07/24/21 Yes Jeannie Fend, PA-C  acetaminophen (TYLENOL) 325 MG tablet Take 2 tablets (650 mg total) by mouth every 4 (four) hours as needed for mild pain (temperature > 101.5.). 07/10/20   Maness, Loistine Chance, MD  ferrous sulfate 325 (65 FE) MG tablet Take 1 tablet (325 mg total) by mouth every other day. 07/10/20   Maness, Loistine Chance, MD  ibuprofen (ADVIL) 600 MG tablet Take 1 tablet (600 mg total) by mouth every 6 (six) hours. 07/10/20   Maness, Loistine Chance, MD  ibuprofen (ADVIL) 600 MG tablet Take 1 tablet (600 mg total) by mouth every 6 (six) hours as needed. 07/26/20   Jacalyn Lefevre, MD  naproxen (NAPROSYN) 375 MG tablet Take 1 tablet (375 mg total) by mouth 2 (two) times daily with a meal. 09/02/20   Wallis Bamberg, PA-C  oxyCODONE (ROXICODONE) 5 MG immediate release tablet Take 1 tablet (5 mg total) by mouth every 8 (eight) hours as needed. 08/10/20 08/10/21  Gita Kudo, MD  simethicone (MYLICON) 80 MG chewable tablet Chew 1 tablet (80 mg total) by mouth as needed for flatulence. 07/10/20   Jovita Kussmaul, MD      Allergies    Patient has no known allergies.    Review of Systems   Review of Systems Negative except as per HPI Physical Exam Updated Vital Signs BP 120/83   Pulse 89   Temp 98.3 F (36.8 C) (Oral)   Resp 15   SpO2 98%  Physical Exam Vitals and nursing note reviewed.  Constitutional:      General: She is not in acute distress.    Appearance: She is well-developed. She is not diaphoretic.  HENT:     Head: Normocephalic and atraumatic.  Pulmonary:     Effort: Pulmonary effort is normal.  Neurological:     Mental Status: She is alert and oriented to person, place, and time.  Psychiatric:        Behavior: Behavior normal.    ED Results / Procedures / Treatments   Labs (all labs ordered are listed, but only abnormal results are displayed) Labs Reviewed  URINALYSIS, ROUTINE W REFLEX MICROSCOPIC - Abnormal; Notable for the following components:      Result Value   APPearance HAZY (*)    Ketones, ur 5 (*)    All other components within normal limits  WET PREP, GENITAL  GC/CHLAMYDIA PROBE AMP () NOT AT [facility]    EKG None  Radiology No results found.  Procedures Procedures  Medications Ordered in ED Medications - No data to display  ED Course/ Medical Decision Making/ A&P                           Medical Decision Making Amount and/or Complexity of Data Reviewed Labs: ordered.  Risk Prescription drug management.   31 year old female with complaint of recurrent vaginal itching without dysuria, frequency, abdominal pain, fever.  Reports the recurrent problem generally treated with Diflucan.  Followed by gynecology and is currently scheduled to be seen on Monday (3 days from now) however came to the ER due to intense itching but could not wait.  No recent STD testing.  No other complaints or concerns.  On exam, is nontoxic, in no distress.  Patient self collected wet prep is negative for BV, yeast, trichomoniasis.  GC chlamydia added  to urine.  Plan is to treat with Diflucan with plan for patient to see her gynecologist as scheduled on Monday.  Question if her symptoms are related to a feminine wash that she is using, advised to discontinue use.        Final Clinical Impression(s) / ED Diagnoses Final diagnoses:  Vaginal itching    Rx / DC Orders ED Discharge Orders          Ordered    fluconazole (DIFLUCAN) 200 MG tablet  Daily        07/23/21 1308              Jeannie Fend, PA-C 07/23/21 1320    Tegeler, Canary Brim, MD 07/23/21 [phone]

## 2021-07-23 NOTE — ED Triage Notes (Signed)
Pt arrives c/o vaginal itching x4, pt has an appt Monday but says it is unbearable. Happens every month around when she would have her cycle. Pt has an IUD and does not have a menstrual period. She has been told it is a vaginal fungus and has been given a pill for this that helps but says it comes back every month.

## 2021-07-23 NOTE — ED Notes (Signed)
Pt verbalizes understanding of discharge instructions. Opportunity for questions and answers were provided. Pt discharged from the ED.   ?

## 2021-07-26 LAB — GC/CHLAMYDIA PROBE AMP (~~LOC~~) NOT AT ARMC
Chlamydia: NEGATIVE
Comment: NEGATIVE
Comment: NORMAL
Neisseria Gonorrhea: NEGATIVE

## 2023-01-15 IMAGING — CT CT ABD-PELV W/ CM
2 of 4 series · 16 of 46 positions shown, 18 images · IV contrast (APPLIED)
Comparison: CT 07/26/2020

CLINICAL DATA: Postop pregnancy abdominal pain

EXAM:
CT ABDOMEN AND PELVIS WITH CONTRAST
TECHNIQUE: Multidetector CT imaging of the abdomen and pelvis was performed
using the standard protocol following bolus administration of
intravenous contrast.
CONTRAST:  75mL OMNIPAQUE IOHEXOL 300 MG/ML  SOLN

[Series 3: abdomen 5.0 · axial · 0.75mm/px · z∈[+818,+1213]mm · 13 of 91 slices shown, 15 images]
[im 6/91  soft-tissue]
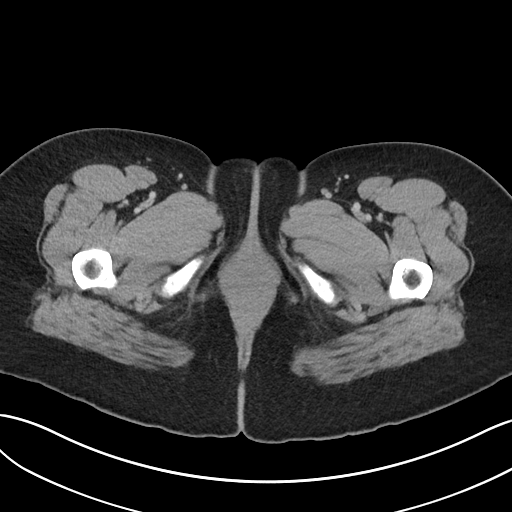
[im 6/91  bone]
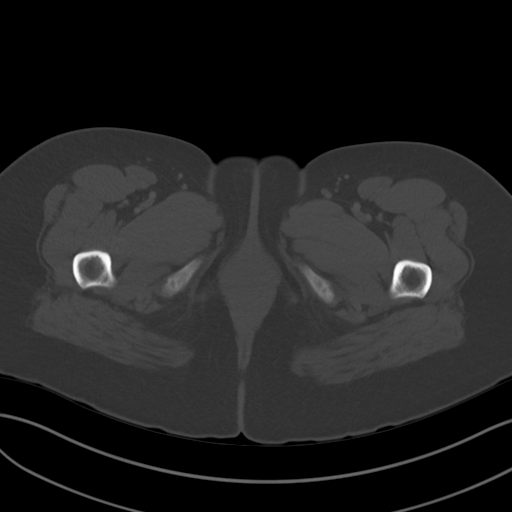
[im 11/91  soft-tissue]
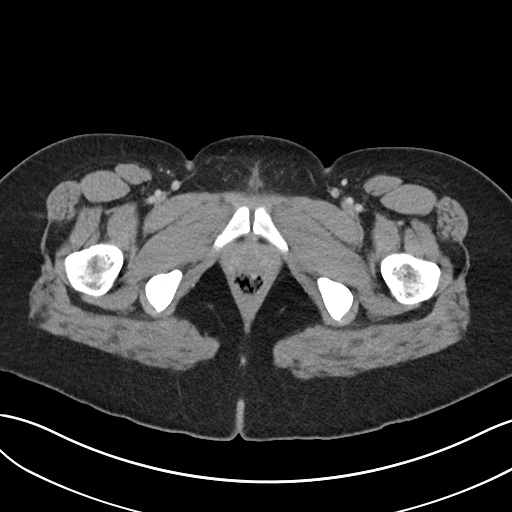
[im 22/91  soft-tissue]
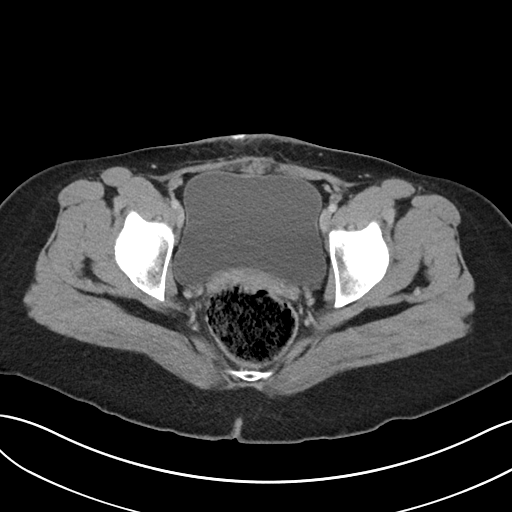
[im 27/91  soft-tissue]
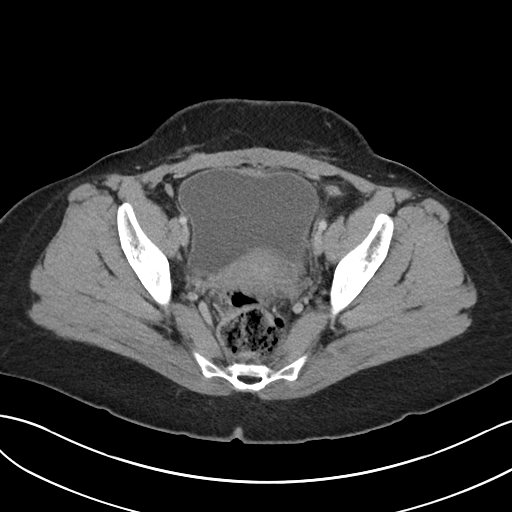
[im 32/91  soft-tissue]
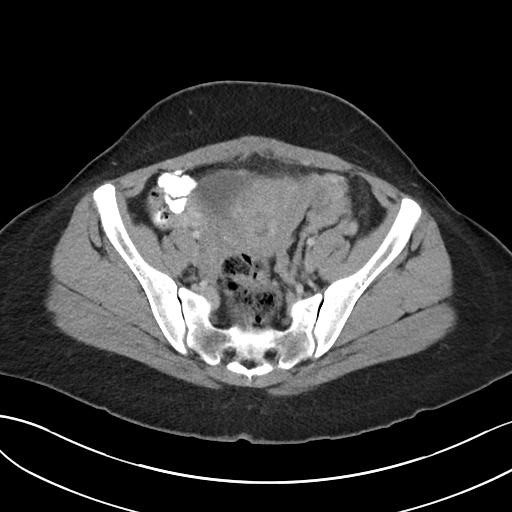
[im 38/91  soft-tissue]
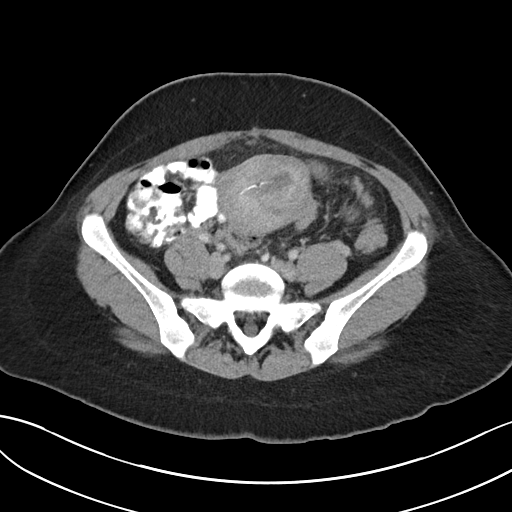
[im 48/91  soft-tissue]
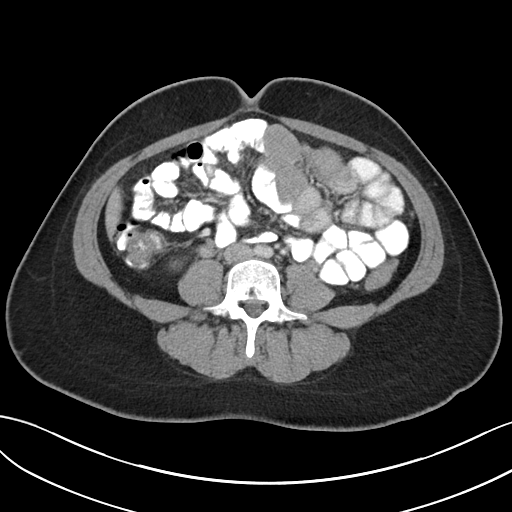
[im 53/91  soft-tissue]
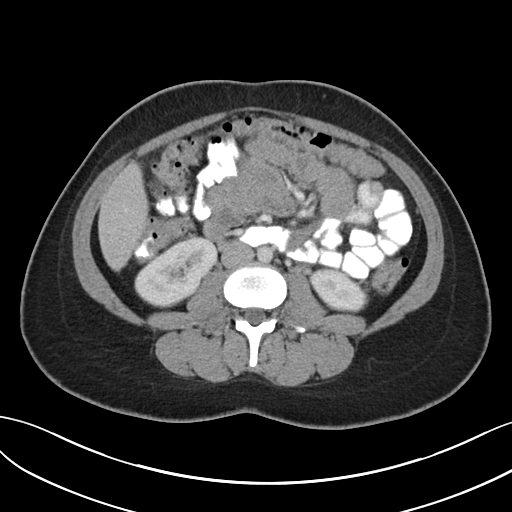
[im 59/91  soft-tissue]
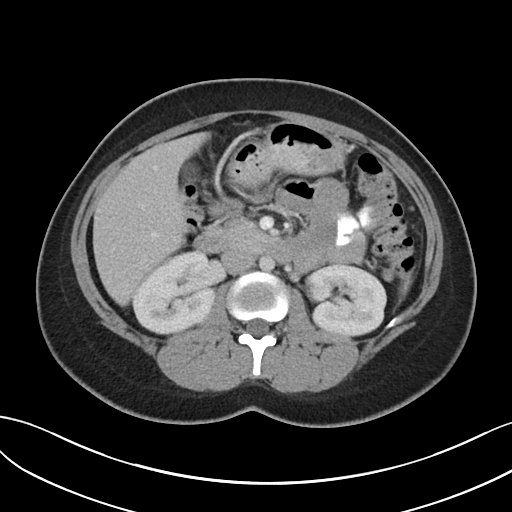
[im 59/91  bone]
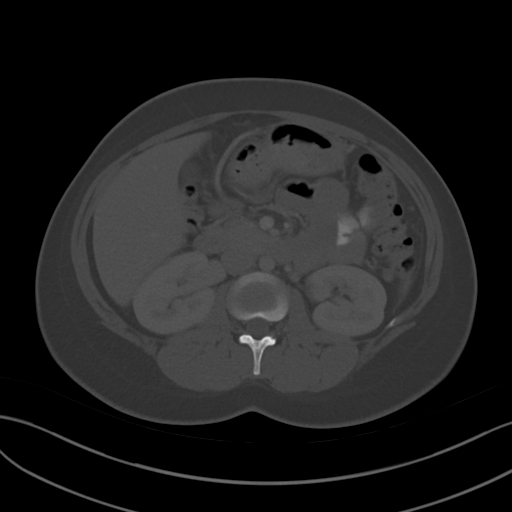
[im 64/91  soft-tissue]
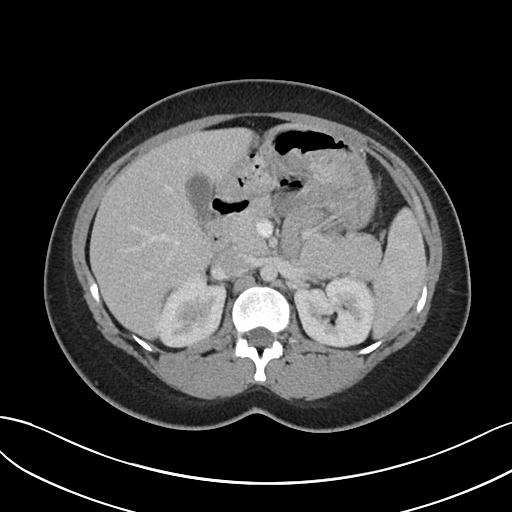
[im 69/91  soft-tissue]
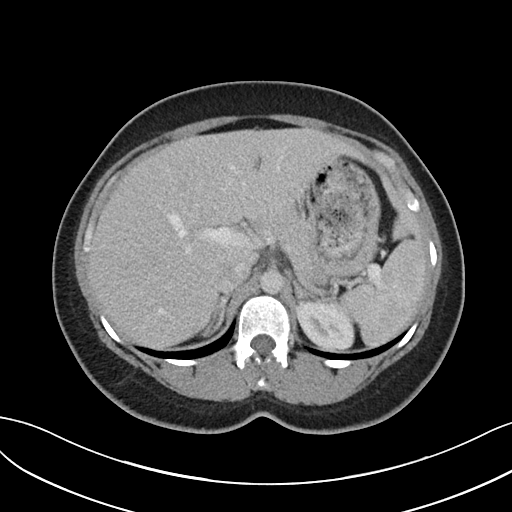
[im 80/91  soft-tissue]
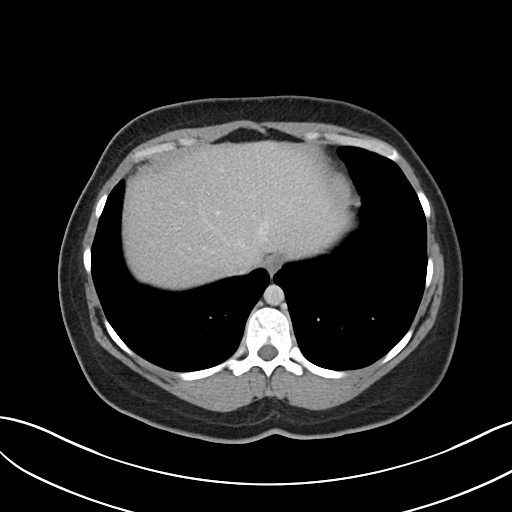
[im 85/91  soft-tissue]
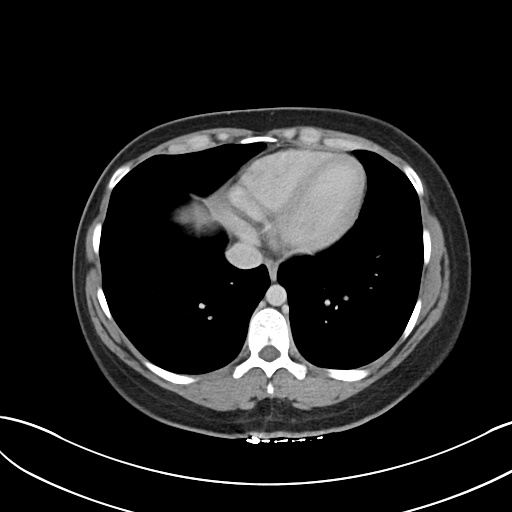

[Series 6: abdomen 3.0 mpr cor · coronal · 0.89mm/px · 3 of 101 slices shown]
[im 34/101  soft-tissue]
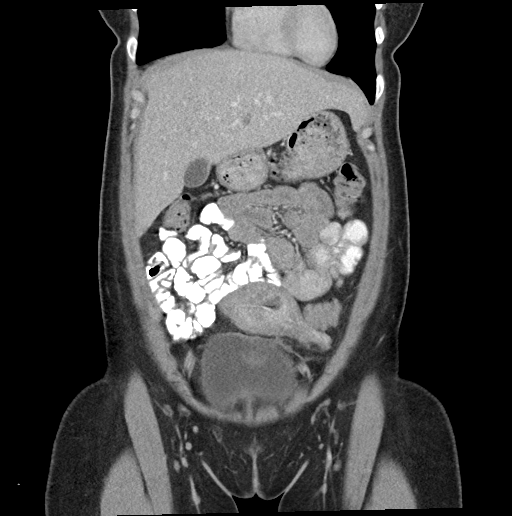
[im 45/101  soft-tissue]
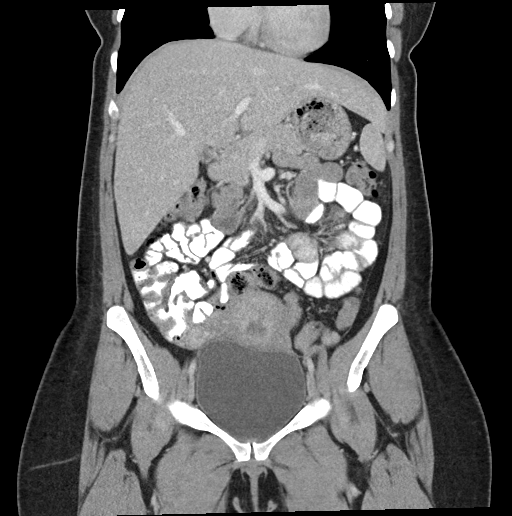
[im 56/101  soft-tissue]
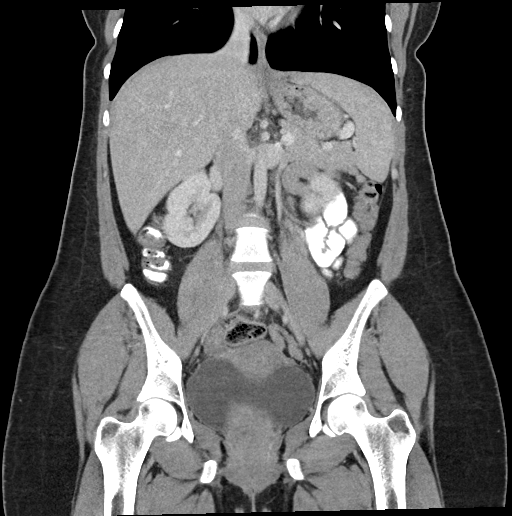

[16 of 46 positions shown; findings below may reference images not displayed]

FINDINGS: Lower chest: Lung bases demonstrate no acute consolidation or
effusion. Normal cardiac size

Hepatobiliary: No focal liver abnormality is seen. No gallstones,
gallbladder wall thickening, or biliary dilatation.

Pancreas: Unremarkable. No pancreatic ductal dilatation or
surrounding inflammatory changes.

Spleen: Normal in size without focal abnormality.

Adrenals/Urinary Tract: Adrenal glands are unremarkable. Kidneys are
normal, without renal calculi, focal lesion, or hydronephrosis.
Bladder is unremarkable.

Stomach/Bowel: Stomach is within normal limits. Appendix appears
normal. No evidence of bowel wall thickening, distention, or
inflammatory changes.

Vascular/Lymphatic: No significant vascular findings are present. No
enlarged abdominal or pelvic lymph nodes.

Reproductive: Resolution of previously noted stranding about the
uterus. Uterine size has slightly decreased compared to prior. An
IUD is present and within the endometrium. Endometrial stripe may be
slightly thickened. No adnexal masses. Negative for pelvic abscess
or hematoma

Other: No abdominal wall hernia or abnormality. No abdominopelvic
ascites.

Musculoskeletal: No acute or significant osseous findings.
IMPRESSION: 1. No definite CT evidence for acute intra-abdominal or pelvic
abnormality.
2. Decreased uterine size compared to prior with resolution of
previously noted stranding. Negative for pelvic abscess or pelvic
hematoma. IUD appears positioned within the uterine fundal and
corpus endometrium. Endometrial stripe is borderline to slightly
thickened, but negative for endometrial gas collection. Correlation
with pelvic ultrasound may be obtained as indicated.
